# Patient Record
Sex: Female | Born: 1990 | Race: Black or African American | Hispanic: No | Marital: Single | Smoking: Never smoker
Health system: Southern US, Community
[De-identification: ages and names within clinical notes are randomized; demographics above are authoritative.]

## PROBLEM LIST (undated history)

## (undated) DIAGNOSIS — K219 Gastro-esophageal reflux disease without esophagitis: Secondary | ICD-10-CM

## (undated) DIAGNOSIS — E0590 Thyrotoxicosis, unspecified without thyrotoxic crisis or storm: Secondary | ICD-10-CM

## (undated) HISTORY — PX: THYROIDECTOMY: SHX17

## (undated) HISTORY — PX: UPPER GI ENDOSCOPY: SHX6162

## (undated) HISTORY — PX: TONSILLECTOMY: SUR1361

---

## 2015-08-01 ENCOUNTER — Encounter (HOSPITAL_COMMUNITY): Payer: Self-pay | Admitting: *Deleted

## 2015-08-01 ENCOUNTER — Inpatient Hospital Stay (HOSPITAL_COMMUNITY)
Admission: AD | Admit: 2015-08-01 | Discharge: 2015-08-01 | Disposition: A | Discharge status: Home / Self Care | Payer: Self-pay | Source: Ambulatory Visit | Attending: Family Medicine | Admitting: Family Medicine

## 2015-08-01 DIAGNOSIS — J449 Chronic obstructive pulmonary disease, unspecified: Secondary | ICD-10-CM | POA: Insufficient documentation

## 2015-08-01 DIAGNOSIS — K529 Noninfective gastroenteritis and colitis, unspecified: Secondary | ICD-10-CM | POA: Insufficient documentation

## 2015-08-01 DIAGNOSIS — E0590 Thyrotoxicosis, unspecified without thyrotoxic crisis or storm: Secondary | ICD-10-CM | POA: Insufficient documentation

## 2015-08-01 HISTORY — DX: Thyrotoxicosis, unspecified without thyrotoxic crisis or storm: E05.90

## 2015-08-01 HISTORY — DX: Gastro-esophageal reflux disease without esophagitis: K21.9

## 2015-08-01 LAB — URINALYSIS, ROUTINE W REFLEX MICROSCOPIC
Bilirubin Urine: NEGATIVE
Glucose, UA: NEGATIVE mg/dL
Ketones, ur: 40 mg/dL — AB
LEUKOCYTES UA: NEGATIVE
NITRITE: NEGATIVE
PROTEIN: NEGATIVE mg/dL
UROBILINOGEN UA: 0.2 mg/dL (ref 0.0–1.0)
pH: 6 (ref 5.0–8.0)

## 2015-08-01 LAB — CBC WITH DIFFERENTIAL/PLATELET
BASOS ABS: 0 10*3/uL (ref 0.0–0.1)
BASOS PCT: 0 % (ref 0–1)
EOS PCT: 1 % (ref 0–5)
Eosinophils Absolute: 0.1 10*3/uL (ref 0.0–0.7)
HEMATOCRIT: 40.5 % (ref 36.0–46.0)
Hemoglobin: 14.3 g/dL (ref 12.0–15.0)
Lymphocytes Relative: 31 % (ref 12–46)
Lymphs Abs: 2.5 10*3/uL (ref 0.7–4.0)
MCH: 28.7 pg (ref 26.0–34.0)
MCHC: 35.3 g/dL (ref 30.0–36.0)
MCV: 81.3 fL (ref 78.0–100.0)
MONO ABS: 0.8 10*3/uL (ref 0.1–1.0)
MONOS PCT: 9 % (ref 3–12)
Neutro Abs: 4.9 10*3/uL (ref 1.7–7.7)
Neutrophils Relative %: 59 % (ref 43–77)
PLATELETS: 149 10*3/uL — AB (ref 150–400)
RBC: 4.98 MIL/uL (ref 3.87–5.11)
RDW: 12.3 % (ref 11.5–15.5)
WBC: 8.3 10*3/uL (ref 4.0–10.5)

## 2015-08-01 LAB — URINE MICROSCOPIC-ADD ON

## 2015-08-01 LAB — POCT PREGNANCY, URINE: PREG TEST UR: NEGATIVE

## 2015-08-01 MED ORDER — PROMETHAZINE HCL 25 MG PO TABS: 25.0000 mg | ORAL_TABLET | Freq: Four times a day (QID) | ORAL | Status: DC | PRN

## 2015-08-01 MED ORDER — ONDANSETRON 8 MG PO TBDP
8.0000 mg | ORAL_TABLET | Freq: Once | ORAL | Status: AC
  Administered 2015-08-01: 8 mg via ORAL
  Filled 2015-08-01: qty 1

## 2015-08-01 NOTE — MAU Note (Signed)
Pt states here for n/v x3 days. Able to keep mashed potatoes down. No vomiting today, just spitting. Bleeding at present. No abnormal vag d/c. Stomach feels weak.

## 2015-08-01 NOTE — MAU Provider Note (Signed)
History     CSN: [id number]  Arrival date and time: 08/01/15 1537   First Provider Initiated Contact with Patient 08/01/15 1756      Chief Complaint  Patient presents with  . Emesis   HPI  Evelyn [name] is a 24 y.o. non pregnant female patient who presents for vomiting.  Pt reports n/v since Friday. Diarrhea over the weekend. Denies vomiting or diarrhea today. States abdomen feels sore and weak but denies pain.  Denies fever.  Able to keep fluids down today but has not tried to eat solids.  Does not have PCP; goes to [facility] for gyn.      Past Medical History  Diagnosis Date  . Hyperthyroidism   . GERD (gastroesophageal reflux disease)     Past Surgical History  Procedure Laterality Date  . Tonsillectomy    . Upper gi endoscopy      History reviewed. No pertinent family history.  Social History  Substance Use Topics  . Smoking status: Never Smoker   . Smokeless tobacco: None  . Alcohol Use: Yes    Allergies:  Allergies  Allergen Reactions  . Penicillins Other (See Comments)    Pt reports "tongue swelling."    Prescriptions prior to admission  Medication Sig Dispense Refill Last Dose  . Cholecalciferol (VITAMIN D-3 PO) Take 1 capsule by mouth daily.   Past Week at Unknown time  . methimazole (TAPAZOLE) 5 MG tablet Take 5 mg by mouth 3 (three) times daily.   07/31/2015 at Unknown time  . Multiple Vitamins-Minerals (MULTIVITAMIN PO) Take 1 tablet by mouth daily.   Past Week at Unknown time  . Probiotic Product (PROBIOTIC PO) Take 1 capsule by mouth once.   08/01/2015 at Unknown time    Review of Systems  Constitutional: Negative.  Negative for fever.  Respiratory: Negative.   Cardiovascular: Negative.   Gastrointestinal: Positive for heartburn and nausea. Negative for vomiting and abdominal pain.  Genitourinary: Negative.   Neurological: Negative for headaches.   Physical Exam   Blood pressure 145/87, pulse 55, temperature 98 F (36.7 C),  temperature source Oral, resp. rate 18, height 5' (1.524 m), weight 107 lb 4 oz (48.648 kg), last menstrual period 07/11/2015.  Physical Exam  Constitutional: She is oriented to person, place, and time. She appears well-developed and well-nourished. No distress.  Cardiovascular: Normal rate, regular rhythm and normal heart sounds.   Respiratory: Effort normal and breath sounds normal. No respiratory distress. She has no wheezes.  GI: Soft. Bowel sounds are normal. She exhibits no distension. There is no tenderness.  Neurological: She is alert and oriented to person, place, and time.  Skin: Skin is warm and dry. She is not diaphoretic. No erythema.  Psychiatric: She has a normal mood and affect. Her behavior is normal. Judgment and thought content normal.    MAU Course  Procedures Results for orders placed or performed during the hospital encounter of 08/01/15 (from the past 24 hour(s))  Urinalysis, Routine w reflex microscopic (not at [facility])     Status: Abnormal   Collection Time: 08/01/15  5:20 PM  Result Value Ref Range   Color, Urine YELLOW YELLOW   APPearance CLEAR CLEAR   Specific Gravity, Urine <1.005 (L) 1.005 - 1.030   pH 6.0 5.0 - 8.0   Glucose, UA NEGATIVE NEGATIVE mg/dL   Hgb urine dipstick LARGE (A) NEGATIVE   Bilirubin Urine NEGATIVE NEGATIVE   Ketones, ur 40 (A) NEGATIVE mg/dL   Protein, ur NEGATIVE NEGATIVE mg/dL  Urobilinogen, UA 0.2 0.0 - 1.0 mg/dL   Nitrite NEGATIVE NEGATIVE   Leukocytes, UA NEGATIVE NEGATIVE  Urine microscopic-add on     Status: Abnormal   Collection Time: 08/01/15  5:20 PM  Result Value Ref Range   Squamous Epithelial / LPF FEW (A) RARE   WBC, UA 0-2 <3 WBC/hpf   RBC / HPF 0-2 <3 RBC/hpf   Bacteria, UA FEW (A) RARE  Pregnancy, urine POC     Status: None   Collection Time: 08/01/15  5:26 PM  Result Value Ref Range   Preg Test, Ur NEGATIVE NEGATIVE    MDM UPT negative CBC with diff Zofran ODT given Nausea improved with zofran; pt did  not vomit while in MAU  Assessment and Plan  A: 1. Gastroenteritis    P: Discharge home in stable condition Rx for phenergan Discussed BRAT diet If symptoms worsen or continue, go to PCP or local ED  Judeth Horn, NP  08/01/2015, 5:57 PM

## 2015-08-01 NOTE — Discharge Instructions (Signed)
Viral Gastroenteritis Viral gastroenteritis is also called stomach flu. This illness is caused by a certain type of germ (virus). It can cause sudden watery poop (diarrhea) and throwing up (vomiting). This can cause you to lose body fluids (dehydration). This illness usually lasts for 3 to 8 days. It usually goes away on its own. HOME CARE   Drink enough fluids to keep your pee (urine) clear or pale yellow. Drink small amounts of fluids often.  Ask your doctor how to replace body fluid losses (rehydration).  Avoid:  Foods high in sugar.  Alcohol.  Bubbly (carbonated) drinks.  Tobacco.  Juice.  Caffeine drinks.  Very hot or cold fluids.  Fatty, greasy foods.  Eating too much at one time.  Dairy products until 24 to 48 hours after your watery poop stops.  You may eat foods with active cultures (probiotics). They can be found in some yogurts and supplements.  Wash your hands well to avoid spreading the illness.  Only take medicines as told by your doctor. Do not give aspirin to children. Do not take medicines for watery poop (antidiarrheals).  Ask your doctor if you should keep taking your regular medicines.  Keep all doctor visits as told. GET HELP RIGHT AWAY IF:   You cannot keep fluids down.  You do not pee at least once every 6 to 8 hours.  You are short of breath.  You see blood in your poop or throw up. This may look like coffee grounds.  You have belly (abdominal) pain that gets worse or is just in one small spot (localized).  You keep throwing up or having watery poop.  You have a fever.  The patient is a child younger than 3 months, and he or she has a fever.  The patient is a child older than 3 months, and he or she has a fever and problems that do not go away.  The patient is a child older than 3 months, and he or she has a fever and problems that suddenly get worse.  The patient is a baby, and he or she has no tears when crying. MAKE SURE YOU:     Understand these instructions.  Will watch your condition.  Will get help right away if you are not doing well or get worse. Document Released: 05/14/2008 Document Revised: 02/18/2012 Document Reviewed: 09/12/2011 ExitCare Patient Information 2015 [employer]. This information is not intended to replace advice given to you by your health care provider. Make sure you discuss any questions you have with your health care provider.  

## 2019-09-02 ENCOUNTER — Encounter (HOSPITAL_COMMUNITY): Payer: Self-pay | Admitting: *Deleted

## 2019-09-02 ENCOUNTER — Inpatient Hospital Stay (HOSPITAL_COMMUNITY): Payer: Medicaid Other

## 2019-09-02 ENCOUNTER — Inpatient Hospital Stay (HOSPITAL_COMMUNITY)
Admission: AD | Admit: 2019-09-02 | Discharge: 2019-09-02 | Disposition: A | Discharge status: Home / Self Care | Payer: Medicaid Other | Attending: Obstetrics and Gynecology | Admitting: Obstetrics and Gynecology

## 2019-09-02 ENCOUNTER — Other Ambulatory Visit: Payer: Self-pay

## 2019-09-02 DIAGNOSIS — O4691 Antepartum hemorrhage, unspecified, first trimester: Secondary | ICD-10-CM

## 2019-09-02 DIAGNOSIS — E876 Hypokalemia: Secondary | ICD-10-CM

## 2019-09-02 DIAGNOSIS — O219 Vomiting of pregnancy, unspecified: Secondary | ICD-10-CM

## 2019-09-02 DIAGNOSIS — Z880 Allergy status to penicillin: Secondary | ICD-10-CM | POA: Insufficient documentation

## 2019-09-02 DIAGNOSIS — Z3A08 8 weeks gestation of pregnancy: Secondary | ICD-10-CM | POA: Insufficient documentation

## 2019-09-02 DIAGNOSIS — E0590 Thyrotoxicosis, unspecified without thyrotoxic crisis or storm: Secondary | ICD-10-CM | POA: Insufficient documentation

## 2019-09-02 DIAGNOSIS — Z79899 Other long term (current) drug therapy: Secondary | ICD-10-CM | POA: Diagnosis not present

## 2019-09-02 DIAGNOSIS — O4690 Antepartum hemorrhage, unspecified, unspecified trimester: Secondary | ICD-10-CM

## 2019-09-02 DIAGNOSIS — O99281 Endocrine, nutritional and metabolic diseases complicating pregnancy, first trimester: Secondary | ICD-10-CM | POA: Diagnosis not present

## 2019-09-02 DIAGNOSIS — Z3A01 Less than 8 weeks gestation of pregnancy: Secondary | ICD-10-CM | POA: Diagnosis not present

## 2019-09-02 DIAGNOSIS — O209 Hemorrhage in early pregnancy, unspecified: Secondary | ICD-10-CM | POA: Insufficient documentation

## 2019-09-02 LAB — COMPREHENSIVE METABOLIC PANEL
ALT: 18 U/L (ref 0–44)
AST: 22 U/L (ref 15–41)
Albumin: 4.3 g/dL (ref 3.5–5.0)
Alkaline Phosphatase: 32 U/L — ABNORMAL LOW (ref 38–126)
Anion gap: 16 — ABNORMAL HIGH (ref 5–15)
BUN: 11 mg/dL (ref 6–20)
CO2: 21 mmol/L — ABNORMAL LOW (ref 22–32)
Calcium: 9.4 mg/dL (ref 8.9–10.3)
Chloride: 92 mmol/L — ABNORMAL LOW (ref 98–111)
Creatinine, Ser: 0.73 mg/dL (ref 0.44–1.00)
GFR calc Af Amer: >60 mL/min (ref >60)
GFR calc non Af Amer: >60 mL/min (ref >60)
Glucose, Bld: 78 mg/dL (ref 70–99)
Potassium: 3 mmol/L — ABNORMAL LOW (ref 3.5–5.1)
Sodium: 129 mmol/L — ABNORMAL LOW (ref 135–145)
Total Bilirubin: 2.9 mg/dL — ABNORMAL HIGH (ref 0.3–1.2)
Total Protein: 7.5 g/dL (ref 6.5–8.1)

## 2019-09-02 LAB — URINALYSIS, ROUTINE W REFLEX MICROSCOPIC
Bilirubin Urine: NEGATIVE
Glucose, UA: NEGATIVE mg/dL
Ketones, ur: 80 mg/dL — AB
Leukocytes,Ua: NEGATIVE
Nitrite: NEGATIVE
Protein, ur: 100 mg/dL — AB
Specific Gravity, Urine: 1.028 (ref 1.005–1.030)
pH: 6 (ref 5.0–8.0)

## 2019-09-02 LAB — CBC
HCT: 39.1 % (ref 36.0–46.0)
Hemoglobin: 14.6 g/dL (ref 12.0–15.0)
MCH: 30.2 pg (ref 26.0–34.0)
MCHC: 37.3 g/dL — ABNORMAL HIGH (ref 30.0–36.0)
MCV: 80.8 fL (ref 80.0–100.0)
Platelets: 173 10*3/uL (ref 150–400)
RBC: 4.84 MIL/uL (ref 3.87–5.11)
RDW: 11.4 % — ABNORMAL LOW (ref 11.5–15.5)
WBC: 10 10*3/uL (ref 4.0–10.5)
nRBC: 0 % (ref 0.0–0.2)

## 2019-09-02 LAB — ABO/RH: ABO/RH(D): O POS

## 2019-09-02 LAB — POCT PREGNANCY, URINE: Preg Test, Ur: POSITIVE — AB

## 2019-09-02 LAB — HCG, QUANTITATIVE, PREGNANCY: hCG, Beta Chain, Quant, S: 167059 m[IU]/mL — ABNORMAL HIGH (ref <5)

## 2019-09-02 MED ORDER — FAMOTIDINE IN NACL 20-0.9 MG/50ML-% IV SOLN
20.0000 mg | Freq: Once | INTRAVENOUS | Status: AC
  Administered 2019-09-02: 12:00:00 20 mg via INTRAVENOUS
  Filled 2019-09-02: qty 50

## 2019-09-02 MED ORDER — PROCHLORPERAZINE EDISYLATE 10 MG/2ML IJ SOLN
10.0000 mg | Freq: Once | INTRAMUSCULAR | Status: AC
  Administered 2019-09-02: 10 mg via INTRAVENOUS
  Filled 2019-09-02: qty 2

## 2019-09-02 MED ORDER — POTASSIUM CHLORIDE ER 10 MEQ PO TBCR: 10.0000 meq | EXTENDED_RELEASE_TABLET | Freq: Every day | ORAL | 0 refills | Status: DC

## 2019-09-02 MED ORDER — M.V.I. ADULT IV INJ
Freq: Once | INTRAVENOUS | Status: AC
  Administered 2019-09-02: 14:00:00 via INTRAVENOUS
  Filled 2019-09-02: qty 10

## 2019-09-02 MED ORDER — LACTATED RINGERS IV BOLUS
1000.0000 mL | Freq: Once | INTRAVENOUS | Status: AC
  Administered 2019-09-02: 1000 mL via INTRAVENOUS

## 2019-09-02 MED ORDER — PROCHLORPERAZINE MALEATE 10 MG PO TABS: 10.0000 mg | ORAL_TABLET | Freq: Two times a day (BID) | ORAL | 0 refills | Status: DC | PRN

## 2019-09-02 NOTE — MAU Note (Signed)
Been sick, very very sick, can't keep anything down, no food or fluids.  preg  Was seen up at [facility] on the 17th, was given Phenergan- not helping. Saw some old blood this morning when woke up, this was the first episode of bleeding. Stomach is sore.

## 2019-09-02 NOTE — Progress Notes (Signed)
States last took Phenergan over 24hrs ago.

## 2019-09-02 NOTE — Discharge Instructions (Signed)
Morning Sickness  Morning sickness is when you feel sick to your stomach (nauseous) during pregnancy. You may feel sick to your stomach and throw up (vomit). You may feel sick in the morning, but you can feel this way at any time of day. Some women feel very sick to their stomach and cannot stop throwing up (hyperemesis gravidarum). Follow these instructions at home: Medicines  Take over-the-counter and prescription medicines only as told by your doctor. Do not take any medicines until you talk with your doctor about them first.  Taking multivitamins before getting pregnant can stop or lessen the harshness of morning sickness. Eating and drinking  Eat dry toast or crackers before getting out of bed.  Eat 5 or 6 small meals a day.  Eat dry and bland foods like rice and baked potatoes.  Do not eat greasy, fatty, or spicy foods.  Have someone cook for you if the smell of food causes you to feel sick or throw up.  If you feel sick to your stomach after taking prenatal vitamins, take them at night or with a snack.  Eat protein when you need a snack. Nuts, yogurt, and cheese are good choices.  Drink fluids throughout the day.  Try ginger ale made with real ginger, ginger tea made from fresh grated ginger, or ginger candies. General instructions  Do not use any products that have nicotine or tobacco in them, such as cigarettes and e-cigarettes. If you need help quitting, ask your doctor.  Use an air purifier to keep the air in your house free of smells.  Get lots of fresh air.  Try to avoid smells that make you feel sick.  Try: ? Wearing a bracelet that is used for seasickness (acupressure wristband). ? Going to a doctor who puts thin needles into certain body points (acupuncture) to improve how you feel. Contact a doctor if:  You need medicine to feel better.  You feel dizzy or light-headed.  You are losing weight. Get help right away if:  You feel very sick to your  stomach and cannot stop throwing up.  You pass out (faint).  You have very bad pain in your belly. Summary  Morning sickness is when you feel sick to your stomach (nauseous) during pregnancy.  You may feel sick in the morning, but you can feel this way at any time of day.  Making some changes to what you eat may help your symptoms go away. This information is not intended to replace advice given to you by your health care provider. Make sure you discuss any questions you have with your health care provider. Document Released: 01/03/2005 Document Revised: 11/08/2017 Document Reviewed: 12/27/2016 Elsevier Patient Education  2020 [employer].   Hypokalemia Hypokalemia means that the amount of potassium in the blood is lower than normal. Potassium is a chemical (electrolyte) that helps regulate the amount of fluid in the body. It also stimulates muscle tightening (contraction) and helps nerves work properly. Normally, most of the body's potassium is inside cells, and only a very small amount is in the blood. Because the amount in the blood is so small, minor changes to potassium levels in the blood can be life-threatening. What are the causes? This condition may be caused by:  Antibiotic medicine.  Diarrhea or vomiting. Taking too much of a medicine that helps you have a bowel movement (laxative) can cause diarrhea and lead to hypokalemia.  Chronic kidney disease (CKD).  Medicines that help the body get rid  of excess fluid (diuretics).  Eating disorders, such as bulimia.  Low magnesium levels in the body.  Sweating a lot. What are the signs or symptoms? Symptoms of this condition include:  Weakness.  Constipation.  Fatigue.  Muscle cramps.  Mental confusion.  Skipped heartbeats or irregular heartbeat (palpitations).  Tingling or numbness. How is this diagnosed? This condition is diagnosed with a blood test. How is this treated? This condition may be treated  by:  Taking potassium supplements by mouth.  Adjusting the medicines that you take.  Eating more foods that contain a lot of potassium. If your potassium level is very low, you may need to get potassium through an IV and be monitored in the hospital. Follow these instructions at home:   Take over-the-counter and prescription medicines only as told by your health care provider. This includes vitamins and supplements.  Eat a healthy diet. A healthy diet includes fresh fruits and vegetables, whole grains, healthy fats, and lean proteins.  If instructed, eat more foods that contain a lot of potassium. This includes: ? Nuts, such as peanuts and pistachios. ? Seeds, such as sunflower seeds and pumpkin seeds. ? Peas, lentils, and lima beans. ? Whole grain and bran cereals and breads. ? Fresh fruits and vegetables, such as apricots, avocado, bananas, cantaloupe, kiwi, oranges, tomatoes, asparagus, and potatoes. ? Orange juice. ? Tomato juice. ? Red meats. ? Yogurt.  Keep all follow-up visits as told by your health care provider. This is important. Contact a health care provider if you:  Have weakness that gets worse.  Feel your heart pounding or racing.  Vomit.  Have diarrhea.  Have diabetes (diabetes mellitus) and you have trouble keeping your blood sugar (glucose) in your target range. Get help right away if you:  Have chest pain.  Have shortness of breath.  Have vomiting or diarrhea that lasts for more than 2 days.  Faint. Summary  Hypokalemia means that the amount of potassium in the blood is lower than normal.  This condition is diagnosed with a blood test.  Hypokalemia may be treated by taking potassium supplements, adjusting the medicines that you take, or eating more foods that are high in potassium.  If your potassium level is very low, you may need to get potassium through an IV and be monitored in the hospital. This information is not intended to replace  advice given to you by your health care provider. Make sure you discuss any questions you have with your health care provider. Document Released: 11/26/2005 Document Revised: 07/09/2018 Document Reviewed: 07/09/2018 Elsevier Patient Education  2020 [employer].

## 2019-09-02 NOTE — MAU Provider Note (Signed)
History     CSN: [id number]  Arrival date and time: 09/02/19 1032   First Provider Initiated Contact with Patient 09/02/19 1147     Chief Complaint  Patient presents with  . Emesis   Evelyn [name] is a 28 y.o. G1P0 at [redacted]w[redacted]d who presents to MAU with nausea and vomiting. She reports this has been occurring for the past 2 weeks. Was seen at [facility] on 9/17- was given phenergan. She reports getting one bag of fluid and was sent home with Rx. She reports being unable to keep any down- fluids or food. Reports vomiting around 20 times per day. She reports weighing 106lbs prior to getting pregnant, today she weighs 99.6lbs. She denies lower abdominal cramping reports upper abdominal pain- epigastric, "stomach ache", rates pain 9/10. Reports seeing old red blood in urine when she wiped this morning, denies passing clots or having to wear pad or panty liner. Denies vaginal discharge or urinary symptoms. She reports headache which is associated with dehydration. Rates HA 8/10.   OB History    Gravida  1   Para  0   Term  0   Preterm  0   AB  0   Living  0     SAB  0   TAB  0   Ectopic  0   Multiple  0   Live Births              Past Medical History:  Diagnosis Date  . GERD (gastroesophageal reflux disease)   . Hyperthyroidism     Past Surgical History:  Procedure Laterality Date  . THYROIDECTOMY    . TONSILLECTOMY    . UPPER GI ENDOSCOPY      No family history on file.  Social History   Tobacco Use  . Smoking status: Never Smoker  . Smokeless tobacco: Never Used  Substance Use Topics  . Alcohol use: Not Currently  . Drug use: Yes    Types: Marijuana    Comment: quit when found out pregnant    Allergies:  Allergies  Allergen Reactions  . Penicillins Other (See Comments)    Pt reports "tongue swelling."    Medications Prior to Admission  Medication Sig Dispense Refill Last Dose  . Cholecalciferol (VITAMIN D-3 PO) Take 1 capsule by mouth  daily.     . methimazole (TAPAZOLE) 5 MG tablet Take 5 mg by mouth 3 (three) times daily.     . Multiple Vitamins-Minerals (MULTIVITAMIN PO) Take 1 tablet by mouth daily.     . Probiotic Product (PROBIOTIC PO) Take 1 capsule by mouth once.     . promethazine (PHENERGAN) 25 MG tablet Take 1 tablet (25 mg total) by mouth every 6 (six) hours as needed for nausea or vomiting. 30 tablet 0     Review of Systems  Constitutional: Negative.   Respiratory: Negative.   Cardiovascular: Negative.   Gastrointestinal: Positive for abdominal pain, nausea and vomiting. Negative for constipation and diarrhea.       Epigastric pain  Genitourinary: Positive for vaginal bleeding. Negative for difficulty urinating, dysuria, frequency, hematuria, urgency and vaginal discharge.  Musculoskeletal: Negative.   Neurological: Positive for weakness and headaches. Negative for dizziness, syncope and light-headedness.  Psychiatric/Behavioral: Negative.    Physical Exam   Blood pressure 135/80, pulse 73, temperature 98.3 F (36.8 C), temperature source Oral, resp. rate 14, height 5\' 1"  (1.549 m), weight 45.3 kg, last menstrual period 07/05/2019, SpO2 100 %.  Physical Exam  Nursing note  and vitals reviewed. Constitutional: She is oriented to person, place, and time. She appears well-developed and well-nourished. No distress.  HENT:  Head: Normocephalic.  Cardiovascular: Normal rate and regular rhythm.  Respiratory: Effort normal and breath sounds normal. No respiratory distress. She has no wheezes.  GI: Soft. Bowel sounds are normal. She exhibits no distension. There is abdominal tenderness. There is no rebound.  Tenderness in the epigastric region  Genitourinary:    Genitourinary Comments: No vaginal bleeding currently    Musculoskeletal: Normal range of motion.        General: No edema.  Neurological: She is alert and oriented to person, place, and time.  Skin:  Mucous membranes dry   Psychiatric: She has a  normal mood and affect. Her behavior is normal. Thought content normal.    MAU Course  Procedures  MDM Orders Placed This Encounter  Procedures  . US OB LESS THAN 14 WEEKS WITH OB TRANSVAGINAL  . US OB Transvaginal  . Urinalysis, Routine w reflex microscopic  . CBC  . hCG, quantitative, pregnancy  . Comprehensive metabolic panel  . Pregnancy, urine POC  . ABO/Rh  . Insert peripheral IV   Meds ordered this encounter  Medications  . lactated ringers bolus 1,000 mL  . prochlorperazine (COMPAZINE) injection 10 mg  . multivitamins adult (INFUVITE ADULT) 10 mL in lactated ringers 1,000 mL infusion  . famotidine (PEPCID) IVPB 20 mg premix  . prochlorperazine (COMPAZINE) 10 MG tablet    Sig: Take 1 tablet (10 mg total) by mouth 2 (two) times daily as needed for nausea or vomiting.    Dispense:  20 tablet    Refill:  0    Order Specific Question:   Supervising Provider    Answer:   ERVIN, MICHAEL L [[id number]]  . potassium chloride (K-DUR) 10 MEQ tablet    Sig: Take 1 tablet (10 mEq total) by mouth daily.    Dispense:  4 tablet    Refill:  0    Order Specific Question:   Supervising Provider    Answer:   Rip Harbour, MICHAEL L [[id number]]   Treatments in MAU included IV LR bolus, compazine IV, banana bag and pepcid IV. PO challenge after treatment. Patient able to keep down crackers and sprite.   Discussed [place] results with patient. Educated on low FHR with [place] and dating changed to [redacted]w[redacted]d- EDD 04/27/2019. Discussed need for repeat US in 1-2 weeks for viability- threatened miscarriage precautions reviewed. Pt stable at time of discharge.   Rx for compazine and KDur sent to pharmacy of choice  Assessment and Plan   1. Nausea and vomiting during pregnancy   2. Vaginal bleeding during pregnancy   3. Hypokalemia    Discharge home Instructed to call office and have NOB appointment moved back based on new EDD Take medication as prescribed  Return to MAU as needed for emergencies   Follow-up  Information    [facility] Follow up.   Specialty: Obstetrics and Gynecology Why: Return to MAU as needed for emergencies  Contact information: [address] [id number] [place] [phone]         Allergies as of 09/02/2019      Reactions   Penicillins Other (See Comments)   Pt reports "tongue swelling."      Medication List    STOP taking these medications   methimazole 5 MG tablet Commonly known as: TAPAZOLE     TAKE these medications   MULTIVITAMIN  PO Take 1 tablet by mouth daily.   potassium chloride 10 MEQ tablet Commonly known as: K-DUR Take 1 tablet (10 mEq total) by mouth daily.   PROBIOTIC PO Take 1 capsule by mouth once.   prochlorperazine 10 MG tablet Commonly known as: COMPAZINE Take 1 tablet (10 mg total) by mouth 2 (two) times daily as needed for nausea or vomiting.   promethazine 25 MG tablet Commonly known as: PHENERGAN Take 1 tablet (25 mg total) by mouth every 6 (six) hours as needed for nausea or vomiting.   VITAMIN D-3 PO Take 1 capsule by mouth daily.       Sharyon Cable CNM 09/02/2019, 5:01 PM

## 2019-09-13 ENCOUNTER — Inpatient Hospital Stay (HOSPITAL_COMMUNITY)
Admission: AD | Admit: 2019-09-13 | Discharge: 2019-09-14 | Disposition: A | Discharge status: Home / Self Care | Payer: Medicaid Other | Attending: Obstetrics and Gynecology | Admitting: Obstetrics and Gynecology

## 2019-09-13 ENCOUNTER — Other Ambulatory Visit: Payer: Self-pay

## 2019-09-13 ENCOUNTER — Encounter (HOSPITAL_COMMUNITY): Payer: Self-pay

## 2019-09-13 DIAGNOSIS — M545 Low back pain: Secondary | ICD-10-CM | POA: Insufficient documentation

## 2019-09-13 DIAGNOSIS — O99322 Drug use complicating pregnancy, second trimester: Secondary | ICD-10-CM | POA: Insufficient documentation

## 2019-09-13 DIAGNOSIS — O210 Mild hyperemesis gravidarum: Secondary | ICD-10-CM | POA: Diagnosis present

## 2019-09-13 DIAGNOSIS — O219 Vomiting of pregnancy, unspecified: Secondary | ICD-10-CM | POA: Diagnosis not present

## 2019-09-13 DIAGNOSIS — Z3A01 Less than 8 weeks gestation of pregnancy: Secondary | ICD-10-CM | POA: Insufficient documentation

## 2019-09-13 DIAGNOSIS — F1990 Other psychoactive substance use, unspecified, uncomplicated: Secondary | ICD-10-CM

## 2019-09-13 DIAGNOSIS — F12188 Cannabis abuse with other cannabis-induced disorder: Secondary | ICD-10-CM | POA: Insufficient documentation

## 2019-09-13 DIAGNOSIS — Z3490 Encounter for supervision of normal pregnancy, unspecified, unspecified trimester: Secondary | ICD-10-CM

## 2019-09-13 DIAGNOSIS — O99321 Drug use complicating pregnancy, first trimester: Secondary | ICD-10-CM | POA: Diagnosis not present

## 2019-09-13 DIAGNOSIS — F1290 Cannabis use, unspecified, uncomplicated: Secondary | ICD-10-CM

## 2019-09-13 DIAGNOSIS — R109 Unspecified abdominal pain: Secondary | ICD-10-CM | POA: Diagnosis not present

## 2019-09-13 DIAGNOSIS — R1116 Cannabis hyperemesis syndrome: Secondary | ICD-10-CM

## 2019-09-13 LAB — URINALYSIS, ROUTINE W REFLEX MICROSCOPIC
Bilirubin Urine: NEGATIVE
Glucose, UA: NEGATIVE mg/dL
Hgb urine dipstick: NEGATIVE
Ketones, ur: 80 mg/dL — AB
Nitrite: NEGATIVE
Protein, ur: 100 mg/dL — AB
Specific Gravity, Urine: 1.029 (ref 1.005–1.030)
pH: 6 (ref 5.0–8.0)

## 2019-09-13 LAB — RAPID URINE DRUG SCREEN, HOSP PERFORMED
Amphetamines: NOT DETECTED
Barbiturates: NOT DETECTED
Benzodiazepines: NOT DETECTED
Cocaine: NOT DETECTED
Opiates: NOT DETECTED
Tetrahydrocannabinol: POSITIVE — AB

## 2019-09-13 MED ORDER — LACTATED RINGERS IV BOLUS
1000.0000 mL | Freq: Once | INTRAVENOUS | Status: AC
  Administered 2019-09-13: 1000 mL via INTRAVENOUS

## 2019-09-13 MED ORDER — DIPHENHYDRAMINE HCL 50 MG/ML IJ SOLN
25.0000 mg | Freq: Once | INTRAMUSCULAR | Status: AC
  Administered 2019-09-13: 25 mg via INTRAVENOUS
  Filled 2019-09-13: qty 1

## 2019-09-13 MED ORDER — ONDANSETRON 4 MG PO TBDP
4.0000 mg | ORAL_TABLET | Freq: Once | ORAL | Status: AC
  Administered 2019-09-13: 4 mg via ORAL
  Filled 2019-09-13: qty 1

## 2019-09-13 MED ORDER — PROMETHAZINE HCL 25 MG PO TABS: 25.0000 mg | ORAL_TABLET | Freq: Four times a day (QID) | ORAL | 0 refills | Status: DC | PRN

## 2019-09-13 MED ORDER — M.V.I. ADULT IV INJ
Freq: Once | INTRAVENOUS | Status: AC
  Administered 2019-09-13: 22:00:00 via INTRAVENOUS
  Filled 2019-09-13: qty 10

## 2019-09-13 MED ORDER — HALOPERIDOL LACTATE 5 MG/ML IJ SOLN
5.0000 mg | Freq: Once | INTRAMUSCULAR | Status: AC
  Administered 2019-09-13: 22:00:00 5 mg via INTRAVENOUS
  Filled 2019-09-13: qty 1

## 2019-09-13 MED ORDER — PYRIDOXINE HCL 25 MG PO TABS: 25.0000 mg | ORAL_TABLET | Freq: Three times a day (TID) | ORAL | 0 refills | Status: DC

## 2019-09-13 MED ORDER — DOXYLAMINE SUCCINATE (SLEEP) 25 MG PO TABS: 25.0000 mg | ORAL_TABLET | Freq: Three times a day (TID) | ORAL | 0 refills | Status: DC | PRN

## 2019-09-13 NOTE — MAU Provider Note (Signed)
History     CSN: [id number]  Arrival date and time: 09/13/19 5537   First Provider Initiated Contact with Patient 09/13/19 2027     Chief Complaint  Patient presents with  . Nausea  . Emesis   HPI  Evelyn [name] is a 28 y.o. G1P0000 at [redacted]w[redacted]d who presents to MAU with chief complaint of severe, recurrent nausea and vomiting. Patient estimates she vomits more than 10 times each day. She was evaluated in MAU for this complaint on 09/02/2019 and prescribed Phenergan and Compazine. She most recently took those medications this morning but did not experience relief.  She cannot remember when she last tolerated solid food but endorses attempting applesauce and broth earlier today without success. She reports abdominal cramping and low back pain during vomiting episodes. She denies vaginal bleeding, abnormal vaginal discharge, abdominal tenderness, weakness or syncope.  OB History    Gravida  1   Para  0   Term  0   Preterm  0   AB  0   Living  0     SAB  0   TAB  0   Ectopic  0   Multiple  0   Live Births              Past Medical History:  Diagnosis Date  . GERD (gastroesophageal reflux disease)   . Hyperthyroidism     Past Surgical History:  Procedure Laterality Date  . THYROIDECTOMY    . TONSILLECTOMY    . UPPER GI ENDOSCOPY      No family history on file.  Social History   Tobacco Use  . Smoking status: Never Smoker  . Smokeless tobacco: Never Used  Substance Use Topics  . Alcohol use: Not Currently  . Drug use: Yes    Types: Marijuana    Comment: quit when found out pregnant    Allergies:  Allergies  Allergen Reactions  . Penicillins Other (See Comments)    Pt reports "tongue swelling."    Medications Prior to Admission  Medication Sig Dispense Refill Last Dose  . Multiple Vitamins-Minerals (MULTIVITAMIN PO) Take 1 tablet by mouth daily.   09/13/2019 at Unknown time  . prochlorperazine (COMPAZINE) 10 MG tablet Take 1 tablet (10 mg total)  by mouth 2 (two) times daily as needed for nausea or vomiting. 20 tablet 0 09/13/2019 at Unknown time  . Cholecalciferol (VITAMIN D-3 PO) Take 1 capsule by mouth daily.     . potassium chloride (K-DUR) 10 MEQ tablet Take 1 tablet (10 mEq total) by mouth daily. 4 tablet 0   . Probiotic Product (PROBIOTIC PO) Take 1 capsule by mouth once.     . promethazine (PHENERGAN) 25 MG tablet Take 1 tablet (25 mg total) by mouth every 6 (six) hours as needed for nausea or vomiting. 30 tablet 0     Review of Systems  Constitutional: Positive for fatigue. Negative for chills and fever.  Gastrointestinal: Positive for abdominal pain, nausea and vomiting.  Genitourinary: Negative for difficulty urinating, dysuria, flank pain, vaginal bleeding, vaginal discharge and vaginal pain.  Musculoskeletal: Positive for back pain.  Neurological: Negative for dizziness, syncope, weakness and headaches.  All other systems reviewed and are negative.  Physical Exam   Pulse (!) 114, temperature 98.4 F (36.9 C), resp. rate 20, height 5\' 1"  (1.549 m), weight 42.6 kg, last menstrual period 07/05/2019, SpO2 100 %.  Physical Exam  Nursing note and vitals reviewed. Constitutional: She is oriented to person, place, and time.  She appears well-developed and well-nourished.  Cardiovascular: Normal rate.  Respiratory: Effort normal and breath sounds normal.  GI: Soft. Bowel sounds are normal. She exhibits no distension. There is no abdominal tenderness. There is no rebound, no guarding and no CVA tenderness.  Neurological: She is alert and oriented to person, place, and time.  Skin: Skin is warm and dry.  Psychiatric: She has a normal mood and affect. Her behavior is normal. Judgment and thought content normal.    MAU Course/MDM  Procedures  --Greater than 15 minutes spent at bedside discussing Cannabis Hyperemesis with patient and FOB. Patient denies recent [facility] use, acknowledges possibility of history of frequent  use. --Reviewed diet modifications to reduce episodes, ideal foods to try with focus on simple bland carbs --Small change to antiemetic regimen: begin B6+Unisom q 8 hours as primary intervention along with diet, Compazine and THC abstinence, use Phenergan for breakthrough  Patient Vitals for the past 24 hrs:  BP Temp Pulse Resp SpO2 Height Weight  09/13/19 2356 130/69 - 80 17 - - -  09/13/19 2250 (!) 142/84 - 97 - - - -  09/13/19 2031 (!) 142/92 - 80 - - - -  09/13/19 2011 - 98.4 F (36.9 C) (!) 114 20 100 % 5\' 1"  (1.549 m) 42.6 kg   Results for orders placed or performed during the hospital encounter of 09/13/19 (from the past 24 hour(s))  Urinalysis, Routine w reflex microscopic     Status: Abnormal   Collection Time: 09/13/19  8:31 PM  Result Value Ref Range   Color, Urine AMBER (A) YELLOW   APPearance CLOUDY (A) CLEAR   Specific Gravity, Urine 1.029 1.005 - 1.030   pH 6.0 5.0 - 8.0   Glucose, UA NEGATIVE NEGATIVE mg/dL   Hgb urine dipstick NEGATIVE NEGATIVE   Bilirubin Urine NEGATIVE NEGATIVE   Ketones, ur 80 (A) NEGATIVE mg/dL   Protein, ur 11/13/19 (A) NEGATIVE mg/dL   Nitrite NEGATIVE NEGATIVE   Leukocytes,Ua TRACE (A) NEGATIVE   RBC / HPF 6-10 0 - 5 RBC/hpf   WBC, UA 6-10 0 - 5 WBC/hpf   Bacteria, UA MANY (A) NONE SEEN   Squamous Epithelial / LPF 21-50 0 - 5   Mucus PRESENT   Rapid urine drug screen (hospital performed)     Status: Abnormal   Collection Time: 09/13/19  8:31 PM  Result Value Ref Range   Opiates NONE DETECTED NONE DETECTED   Cocaine NONE DETECTED NONE DETECTED   Benzodiazepines NONE DETECTED NONE DETECTED   Amphetamines NONE DETECTED NONE DETECTED   Tetrahydrocannabinol POSITIVE (A) NONE DETECTED   Barbiturates NONE DETECTED NONE DETECTED   Meds ordered this encounter  Medications  . lactated ringers bolus 1,000 mL  . ondansetron (ZOFRAN-ODT) disintegrating tablet 4 mg  . multivitamins adult (INFUVITE ADULT) 10 mL in lactated ringers 1,000 mL infusion   . haloperidol lactate (HALDOL) injection 5 mg  . diphenhydrAMINE (BENADRYL) injection 25 mg  . pyridOXINE (VITAMIN B-6) 25 MG tablet    Sig: Take 1 tablet (25 mg total) by mouth every 8 (eight) hours.    Dispense:  30 tablet    Refill:  0    Order Specific Question:   Supervising Provider    Answer:   11/13/19 [place] Bing  . doxylamine, Sleep, (UNISOM) 25 MG tablet    Sig: Take 1 tablet (25 mg total) by mouth every 8 (eight) hours as needed.    Dispense:  30 tablet    Refill:  0  Order Specific Question:   Supervising Provider    Answer:   [place] BingPICKENS, CHARLIE [[id number]]  . promethazine (PHENERGAN) 25 MG tablet    Sig: Take 1 tablet (25 mg total) by mouth every 6 (six) hours as needed for nausea or vomiting. For nausea and vomiting not resolved with B6 and Unisom    Dispense:  30 tablet    Refill:  0    Order Specific Question:   Supervising Provider    Answer:   [place] BingPICKENS, CHARLIE [[id number]]    Assessment and Plan  --28 y.o. G1P0000 at 8560w6d  --Ketonuria, s/p LR bolus and vitamin bag --Cannabis Hyperemesis. Tolerating PO prior to discharge --Discharge home in stable condition  F/U:  --Viability scan and [facility] intake Appt on 09/15/19  Calvert CantorSamantha C Nasim Garofano, CNM 09/14/2019, 12:51 AM

## 2019-09-13 NOTE — Discharge Instructions (Signed)
Cannabinoid Hyperemesis Syndrome °Cannabinoid hyperemesis syndrome (CHS) is a condition that causes repeated nausea, vomiting, and abdominal pain after long-term (chronic) use of marijuana (cannabis). People with CHS typically use marijuana 3-5 times a day for many years before they have symptoms, although it is possible to develop CHS with as little as 1 use per day. °Symptoms of CHS may be mild at first but can get worse and more frequent. In some cases, CHS may cause vomiting many times a day, which can lead to weight loss and dehydration. CHS may go away and come back many times (recur). People may not have symptoms or may otherwise be healthy in between CHS attacks. °What are the causes? °The exact cause of this condition is not known. Long-term use of marijuana may over-stimulate certain proteins in the brain that react with chemicals in marijuana (cannabinoid receptors). This over-stimulation may cause CHS. °What are the signs or symptoms? °Symptoms of this condition are often mild during the first few attacks, but they can get worse over time. Symptoms may include: °· Frequent nausea, especially early in the morning. °· Vomiting. °· Abdominal pain. °Taking several hot showers throughout the day can also be a sign of this condition. People with CHS may do this because it relieves symptoms. °How is this diagnosed? °This condition may be diagnosed based on: °· Your symptoms and medical history, including any drug use. °· A physical exam. °You may have tests done to rule out other problems. These tests may include: °· Blood tests. °· Urine tests. °· Imaging tests, such as an X-ray or CT scan. °How is this treated? °Treatment for this condition involves stopping marijuana use. Your health care provider may recommend: °· A drug rehabilitation program, if you have trouble stopping marijuana use. °· Medicines for nausea. °· Hot showers to help relieve symptoms. °Certain creams that contain a substance called  capsaicin may improve symptoms when applied to the abdomen. Ask your health care provider before starting any medicines or other treatments. °Severe nausea and vomiting may require you to stay at the hospital. You may need IV fluids to prevent or treat dehydration. You may also need certain medicines that must be given at the hospital. °Follow these instructions at home: °During an attack ° °· Stay in bed and rest in a dark, quiet room. °· Take anti-nausea medicine as told by your health care provider. °· Try taking hot showers to relieve your symptoms. °After an attack °· Drink small amounts of clear fluids slowly. Gradually add more. °· Once you are able to eat without vomiting, eat soft foods in small amounts every 3-4 hours. °General instructions ° °· Do not use any products that contain marijuana.If you need help quitting, ask your health care provider for resources and treatment options. °· Drink enough fluid to keep your urine pale yellow. Avoid drinking fluids that have a lot of sugar or caffeine, such as coffee and soda. °· Take and apply over-the-counter and prescription medicines only as told by your health care provider. Ask your health care provider before starting any new medicines or treatments. °· Keep all follow-up visits as told by your health care provider. This is important. °Contact a health care provider if: °· Your symptoms get worse. °· You cannot drink fluids without vomiting. °· You have pain and trouble swallowing after an attack. °Get help right away if: °· You cannot stop vomiting. °· You have blood in your vomit or your vomit looks like coffee grounds. °· You have   severe abdominal pain. °· You have stools that are bloody or black, or stools that look like tar. °· You have symptoms of dehydration, such as: °? Sunken eyes. °? Inability to make tears. °? Cracked lips. °? Dry mouth. °? Decreased urine production. °? Weakness. °? Sleepiness. °? Fainting. °Summary °· Cannabinoid hyperemesis  syndrome (CHS) is a condition that causes repeated nausea, vomiting, and abdominal pain after long-term use of marijuana. °· People with CHS typically use marijuana 3-5 times a day for many years before they have symptoms, although it is possible to develop CHS with as little as 1 use per day. °· Treatment for this condition involves stopping marijuana use. Hot showers and capsaicin creams may also help relieve symptoms. Ask your health care provider before starting any medicines or other treatments. °· Your health care provider may prescribe medicines to help with nausea. °· Get help right away if you have signs of dehydration, such as dry mouth, decreased urine production, or weakness. °This information is not intended to replace advice given to you by your health care provider. Make sure you discuss any questions you have with your health care provider. °Document Released: 03/06/2017 Document Revised: 04/04/2018 Document Reviewed: 03/06/2017 °Elsevier Patient Education © 2020 Elsevier Inc. ° °

## 2019-09-13 NOTE — MAU Note (Signed)
Pt here with N/V. Not able to keep anything down. Reports she has vomited 10x in the last 24 hours. Has nausea medication from previous visit, last took this morning. Does not help. Pt reports upper abdominal and lower back pain. This is an ongoing issue, but getting worse.

## 2019-09-14 ENCOUNTER — Telehealth: Payer: Self-pay | Admitting: Family Medicine

## 2019-09-14 DIAGNOSIS — O99321 Drug use complicating pregnancy, first trimester: Secondary | ICD-10-CM

## 2019-09-14 DIAGNOSIS — Z3A01 Less than 8 weeks gestation of pregnancy: Secondary | ICD-10-CM

## 2019-09-14 DIAGNOSIS — F1290 Cannabis use, unspecified, uncomplicated: Secondary | ICD-10-CM

## 2019-09-14 DIAGNOSIS — O219 Vomiting of pregnancy, unspecified: Secondary | ICD-10-CM

## 2019-09-14 NOTE — Telephone Encounter (Signed)
Called the patient to confirm the upcoming visit. The patient answered no to the covid screening questions. Also informed the patient of no children or visitors due to the covid restrictions. The patient verbalized understanding. °

## 2019-09-15 ENCOUNTER — Ambulatory Visit (INDEPENDENT_AMBULATORY_CARE_PROVIDER_SITE_OTHER): Payer: Medicaid Other | Admitting: Family Medicine

## 2019-09-15 ENCOUNTER — Other Ambulatory Visit: Payer: Self-pay

## 2019-09-15 ENCOUNTER — Ambulatory Visit (HOSPITAL_COMMUNITY)
Admission: RE | Admit: 2019-09-15 | Discharge: 2019-09-15 | Disposition: A | Discharge status: Home / Self Care | Payer: Medicaid Other | Source: Ambulatory Visit | Attending: Certified Nurse Midwife | Admitting: Certified Nurse Midwife

## 2019-09-15 DIAGNOSIS — O021 Missed abortion: Secondary | ICD-10-CM | POA: Diagnosis present

## 2019-09-15 DIAGNOSIS — O4690 Antepartum hemorrhage, unspecified, unspecified trimester: Secondary | ICD-10-CM | POA: Diagnosis present

## 2019-09-15 MED ORDER — MISOPROSTOL 200 MCG PO TABS: 800.0000 ug | ORAL_TABLET | Freq: Once | ORAL | 0 refills | Status: DC

## 2019-09-15 MED ORDER — IBUPROFEN 600 MG PO TABS: 600.0000 mg | ORAL_TABLET | Freq: Four times a day (QID) | ORAL | 1 refills | Status: DC | PRN

## 2019-09-15 MED ORDER — PROMETHAZINE HCL 25 MG PO TABS: 25.0000 mg | ORAL_TABLET | Freq: Four times a day (QID) | ORAL | 1 refills | Status: AC | PRN

## 2019-09-15 MED ORDER — ACETAMINOPHEN 500 MG PO TABS: 500.0000 mg | ORAL_TABLET | Freq: Four times a day (QID) | ORAL | 0 refills | Status: DC | PRN

## 2019-09-15 NOTE — Progress Notes (Signed)
Subjective:  Evelyn [name] is a 28 y.o. G1P0000 at [redacted]w[redacted]d being seen today for ongoing prenatal care.  She is currently monitored for the following issues for this low-risk pregnancy and has Intrauterine pregnancy; Hyperemesis gravidarum; and Substance use on their problem list.  Patient reports no complaints. Denies leaking of fluid.   The following portions of the patient's history were reviewed and updated as appropriate: allergies, current medications, past family history, past medical history, past social history, past surgical history and problem list. Problem list updated.  Objective:  There were no vitals filed for this visit.  Fetal Status: Ultrasound reviewed, seeing no fetal cardiac activity, meeting criteria for failed pregnancy.  General:  Alert, oriented and cooperative. Patient is in no acute distress.  Skin: Skin is warm and dry. No rash noted.   Cardiovascular: Normal heart rate noted  Respiratory: Normal respiratory effort, no problems with respiration noted  Abdomen: Soft, gravid, appropriate for gestational age.       Pelvic:       Cervical exam deferred        Extremities: Normal range of motion.     Mental Status: Normal mood and affect. Normal behavior. Normal judgment and thought content.    Assessment and Plan:  Pregnancy: G1P0000 at [redacted]w[redacted]d  1. Missed abortion with fetal demise before 70 completed weeks of gestation  Early Intrauterine Pregnancy Failure Protocol X  Documented intrauterine pregnancy failure less than or equal to [redacted] weeks gestation  X  No serious current illness  X  Baseline Hgb greater than or equal to 10g/dl  X  Patient has easily accessible transportation to the hospital  X  Clear preference  X  Practitioner/physician deems patient reliable  X  Counseling by practitioner or physician  X  Patient education by RN  X  Consent form signed       Rho-Gam given by RN if indicated  X  Medication dispensed  X  Cytotec 800 mcg Intravaginally by  patient at home       Intravaginally by NP in MAU       Rectally by patient at home       Rectally by RN in MAU  X   Ibuprofen 600 mg 1 tablet by mouth every 6 hours as needed #30 - prescribed  X   Tylenol #3 mg by mouth every 4 to 6 hours as needed - prescribed  X   Phenergan 12.5 mg by mouth every 4 hours as needed for nausea - prescribed  Reviewed with pt cytotec procedure.  Pt verbalizes that she lives close to the hospital and has transportation readily available.  Pt appears reliable and verbalizes understanding and agrees with plan of care  Follow up in 1-2 weeks, for bHCG  Mahathi Pokorney L, DO

## 2019-09-15 NOTE — Patient Instructions (Signed)
Managing Pregnancy Loss °Pregnancy loss can happen any time during a pregnancy. Often the cause is not known. It is rarely because of anything you did. Pregnancy loss in early pregnancy (during the first trimester) is called a miscarriage. This type of pregnancy loss is the most common. Pregnancy loss that happens after 20 weeks of pregnancy is called fetal demise if the baby's heart stops beating before birth. Fetal demise is much less common. Some women experience spontaneous labor shortly after fetal demise resulting in a stillborn birth (stillbirth). °Any pregnancy loss can be devastating. You will need to recover both physically and emotionally. Most women are able to get pregnant again after a pregnancy loss and deliver a healthy baby. °How to manage emotional recovery ° °Pregnancy loss is very hard emotionally. You may feel many different emotions while you grieve. You may feel sad and angry. You may also feel guilty. It is normal to have periods of crying. Emotional recovery can take longer than physical recovery. It is different for everyone. °Taking these steps can help you in managing this loss: °· Remember that it is unlikely you did anything to cause the pregnancy loss. °· Share your thoughts and feelings with friends, family, and your partner. Remember that your partner is also recovering emotionally. °· Make sure you have a good support system. Do not spend too much time alone. °· Meet with a pregnancy loss counselor or join a pregnancy loss support group. °· Get enough sleep and eat a healthy diet. Return to regular exercise when you have recovered physically. °· Do not use drugs or alcohol to manage your emotions. °· Consider seeing a mental health professional to help you recover emotionally. °· Ask a friend or loved one to help you decide what to do with any clothing and nursery items you received for your baby. °In the case of a stillbirth, many women benefit from taking additional steps in the  grieving process. You may want to: °· Hold your baby after the birth. °· Name your baby. °· Request a birth certificate. °· Create a keepsake such as handprints or footprints. °· Dress your baby and have a picture taken. °· Make funeral arrangements. °· Ask for a baptism or blessing. °Hospitals have staff members who can help you with all these arrangements. °How to recognize emotional stress °It is normal to have emotional stress after a pregnancy loss. But emotional stress that lasts a long time or becomes severe requires treatment. Watch out for these signs of severe emotional stress: °· Sadness, anger, or guilt that is not going away and is interfering with your normal activities. °· Relationship problems that have occurred or gotten worse since the pregnancy loss. °· Signs of depression that last longer than 2 weeks. These may include: °? Sadness. °? Anxiety. °? Hopelessness. °? Loss of interest in activities you enjoy. °? Inability to concentrate. °? Trouble sleeping or sleeping too much. °? Loss of appetite or overeating. °? Thoughts of death or of hurting yourself. °Follow these instructions at home: °· Take over-the-counter and prescription medicines only as told by your health care provider. °· Rest at home until your energy level returns. Return to your normal activities as told by your health care provider. Ask your health care provider what activities are safe for you. °· When you are ready, meet with your health care provider to discuss steps to take for a future pregnancy. °· Keep all follow-up visits as told by your health care provider. This is important. °  Where to find support °· To help you and your partner with the process of grieving, talk with your health care provider or seek counseling. °· Consider meeting with others who have experienced pregnancy loss. Ask your health care provider about support groups and resources. °Where to find more information °· [place] Department of Health and Human  Services Office on Women's Health: www.womenshealth.gov °· American Pregnancy Association: www.americanpregnancy.org °Contact a health care provider if: °· You continue to experience grief, sadness, or lack of motivation for everyday activities, and those feelings do not improve over time. °· You are struggling to recover emotionally, especially if you are using alcohol or substances to help. °Get help right away if: °· You have thoughts of hurting yourself or others. °If you ever feel like you may hurt yourself or others, or have thoughts about taking your own life, get help right away. You can go to your nearest emergency department or call: °· Your local emergency services (911 in the [place] °· A suicide crisis helpline, such as the National Suicide Prevention Lifeline at 1-800-273-8255. This is open 24 hours a day. °Summary °· Any pregnancy loss can be difficult physically and emotionally. °· You may experience many different emotions while you grieve. Emotional recovery can last longer than physical recovery. °· It is normal to have emotional stress after a pregnancy loss. But emotional stress that lasts a long time or becomes severe requires treatment. °· See your health care provider if you are struggling emotionally after a pregnancy loss. °This information is not intended to replace advice given to you by your health care provider. Make sure you discuss any questions you have with your health care provider. °Document Released: 02/06/2018 Document Revised: 03/18/2019 Document Reviewed: 02/06/2018 °Elsevier Patient Education © 2020 Elsevier Inc. ° °

## 2019-09-16 ENCOUNTER — Telehealth (INDEPENDENT_AMBULATORY_CARE_PROVIDER_SITE_OTHER): Payer: Medicaid Other | Admitting: Lactation Services

## 2019-09-16 DIAGNOSIS — Z3490 Encounter for supervision of normal pregnancy, unspecified, unspecified trimester: Secondary | ICD-10-CM

## 2019-09-16 NOTE — Telephone Encounter (Signed)
Called and spoke with pt in regards to her questions or concerns.   Pt was questioning about vaginal medication for Cytotec as she did not want a D&C. Discussed that The Cytotec is given to hopefully prevent pt from needing a D&C by stimulating the body to expel the pregnancy. Pt voiced understanding.   Clarified dosage with Kerry Hough, PA that dosage is usually 800 mg and that order should be 4 tablets.   [facility] in [place] to clarify order. Spoke with Diane to clarify that pt needs 4 tablets as a dosage. Pharmacy to also drop her med to fill for Promethazine as was put on hold for unknown reason.   Called pt back to let her know that I have spoken with there pharmacy to clarify her prescriptions. Pt voiced understanding.

## 2019-09-21 ENCOUNTER — Telehealth: Payer: Self-pay

## 2019-09-21 NOTE — Telephone Encounter (Signed)
Pt left message on nurse VM stating she received an email notifying her of new test results. She was unable to log into MyChart. Would like a call back to discuss results.

## 2019-09-22 ENCOUNTER — Telehealth: Payer: Self-pay | Admitting: *Deleted

## 2019-09-22 NOTE — Telephone Encounter (Signed)
Clarrissa called front desk and call transferred to front desk. She reports she can't eat, drink, rest, feels weak. Per chart she had failed pregnancy and was given cytotec which she took 09/16/19 am and reports had 2 days of dark red blood. States she is taking phenergan without relief. She denies dizziness or lightheadedness.  I discussed with Dr. Rip Harbour and per his recommendation I instructed Chancie to go to [facility]U for evaluation. She voices understanding.  Donnivan Villena,RN

## 2019-09-23 ENCOUNTER — Inpatient Hospital Stay (HOSPITAL_COMMUNITY): Payer: Medicaid Other

## 2019-09-23 ENCOUNTER — Inpatient Hospital Stay (HOSPITAL_COMMUNITY)
Admission: AD | Admit: 2019-09-23 | Discharge: 2019-09-25 | DRG: 770 | Disposition: A | Discharge status: Home / Self Care | Payer: Medicaid Other | Source: Ambulatory Visit | Attending: Obstetrics and Gynecology | Admitting: Obstetrics and Gynecology

## 2019-09-23 ENCOUNTER — Other Ambulatory Visit: Payer: Self-pay

## 2019-09-23 ENCOUNTER — Encounter (HOSPITAL_COMMUNITY): Payer: Self-pay

## 2019-09-23 DIAGNOSIS — O039 Complete or unspecified spontaneous abortion without complication: Secondary | ICD-10-CM

## 2019-09-23 DIAGNOSIS — O021 Missed abortion: Principal | ICD-10-CM | POA: Diagnosis present

## 2019-09-23 DIAGNOSIS — Z880 Allergy status to penicillin: Secondary | ICD-10-CM

## 2019-09-23 DIAGNOSIS — E876 Hypokalemia: Secondary | ICD-10-CM

## 2019-09-23 DIAGNOSIS — E349 Endocrine disorder, unspecified: Secondary | ICD-10-CM

## 2019-09-23 DIAGNOSIS — Z681 Body mass index (BMI) 19 or less, adult: Secondary | ICD-10-CM

## 2019-09-23 DIAGNOSIS — O99284 Endocrine, nutritional and metabolic diseases complicating childbirth: Secondary | ICD-10-CM | POA: Diagnosis present

## 2019-09-23 DIAGNOSIS — O99892 Other specified diseases and conditions complicating childbirth: Secondary | ICD-10-CM | POA: Diagnosis present

## 2019-09-23 DIAGNOSIS — E860 Dehydration: Secondary | ICD-10-CM | POA: Diagnosis present

## 2019-09-23 DIAGNOSIS — O211 Hyperemesis gravidarum with metabolic disturbance: Secondary | ICD-10-CM | POA: Diagnosis present

## 2019-09-23 DIAGNOSIS — O219 Vomiting of pregnancy, unspecified: Secondary | ICD-10-CM

## 2019-09-23 DIAGNOSIS — E890 Postprocedural hypothyroidism: Secondary | ICD-10-CM | POA: Diagnosis present

## 2019-09-23 DIAGNOSIS — Z3490 Encounter for supervision of normal pregnancy, unspecified, unspecified trimester: Secondary | ICD-10-CM

## 2019-09-23 DIAGNOSIS — R634 Abnormal weight loss: Secondary | ICD-10-CM | POA: Diagnosis present

## 2019-09-23 DIAGNOSIS — E871 Hypo-osmolality and hyponatremia: Secondary | ICD-10-CM | POA: Diagnosis present

## 2019-09-23 DIAGNOSIS — Z20828 Contact with and (suspected) exposure to other viral communicable diseases: Secondary | ICD-10-CM | POA: Diagnosis present

## 2019-09-23 DIAGNOSIS — E878 Other disorders of electrolyte and fluid balance, not elsewhere classified: Secondary | ICD-10-CM

## 2019-09-23 LAB — URINALYSIS, ROUTINE W REFLEX MICROSCOPIC
Glucose, UA: 150 mg/dL — AB
Hgb urine dipstick: NEGATIVE
Ketones, ur: 20 mg/dL — AB
Leukocytes,Ua: NEGATIVE
Nitrite: NEGATIVE
Protein, ur: 100 mg/dL — AB
Specific Gravity, Urine: 1.031 — ABNORMAL HIGH (ref 1.005–1.030)
pH: 6 (ref 5.0–8.0)

## 2019-09-23 LAB — CBC WITH DIFFERENTIAL/PLATELET
Abs Immature Granulocytes: 0.02 10*3/uL (ref 0.00–0.07)
Basophils Absolute: 0 10*3/uL (ref 0.0–0.1)
Basophils Relative: 0 %
Eosinophils Absolute: 0 10*3/uL (ref 0.0–0.5)
Eosinophils Relative: 0 %
HCT: 33.1 % — ABNORMAL LOW (ref 36.0–46.0)
Hemoglobin: 12 g/dL (ref 12.0–15.0)
Immature Granulocytes: 0 %
Lymphocytes Relative: 20 %
Lymphs Abs: 1.2 10*3/uL (ref 0.7–4.0)
MCH: 28.9 pg (ref 26.0–34.0)
MCHC: 36.3 g/dL — ABNORMAL HIGH (ref 30.0–36.0)
MCV: 79.8 fL — ABNORMAL LOW (ref 80.0–100.0)
Monocytes Absolute: 0.8 10*3/uL (ref 0.1–1.0)
Monocytes Relative: 13 %
Neutro Abs: 3.9 10*3/uL (ref 1.7–7.7)
Neutrophils Relative %: 67 %
Platelets: 162 10*3/uL (ref 150–400)
RBC: 4.15 MIL/uL (ref 3.87–5.11)
RDW: 11.6 % (ref 11.5–15.5)
WBC: 5.9 10*3/uL (ref 4.0–10.5)
nRBC: 0 % (ref 0.0–0.2)

## 2019-09-23 LAB — COMPREHENSIVE METABOLIC PANEL
ALT: 37 U/L (ref 0–44)
AST: 27 U/L (ref 15–41)
Albumin: 3.6 g/dL (ref 3.5–5.0)
Alkaline Phosphatase: 26 U/L — ABNORMAL LOW (ref 38–126)
Anion gap: 15 (ref 5–15)
BUN: 8 mg/dL (ref 6–20)
CO2: 24 mmol/L (ref 22–32)
Calcium: 9.2 mg/dL (ref 8.9–10.3)
Chloride: 92 mmol/L — ABNORMAL LOW (ref 98–111)
Creatinine, Ser: 0.53 mg/dL (ref 0.44–1.00)
GFR calc Af Amer: >60 mL/min (ref >60)
GFR calc non Af Amer: >60 mL/min (ref >60)
Glucose, Bld: 97 mg/dL (ref 70–99)
Potassium: 2.8 mmol/L — ABNORMAL LOW (ref 3.5–5.1)
Sodium: 131 mmol/L — ABNORMAL LOW (ref 135–145)
Total Bilirubin: 1.6 mg/dL — ABNORMAL HIGH (ref 0.3–1.2)
Total Protein: 6.9 g/dL (ref 6.5–8.1)

## 2019-09-23 LAB — TYPE AND SCREEN
ABO/RH(D): O POS
Antibody Screen: NEGATIVE

## 2019-09-23 LAB — WET PREP, GENITAL
Clue Cells Wet Prep HPF POC: NONE SEEN
Sperm: NONE SEEN
Trich, Wet Prep: NONE SEEN
Yeast Wet Prep HPF POC: NONE SEEN

## 2019-09-23 LAB — HCG, QUANTITATIVE, PREGNANCY: hCG, Beta Chain, Quant, S: 295585 m[IU]/mL — ABNORMAL HIGH (ref <5)

## 2019-09-23 MED ORDER — ACETAMINOPHEN 325 MG PO TABS: 650.0000 mg | ORAL_TABLET | ORAL | Status: DC | PRN

## 2019-09-23 MED ORDER — LACTATED RINGERS IV BOLUS
1000.0000 mL | Freq: Once | INTRAVENOUS | Status: AC
  Administered 2019-09-23: 16:00:00 1000 mL via INTRAVENOUS

## 2019-09-23 MED ORDER — ZOLPIDEM TARTRATE 5 MG PO TABS: 5.0000 mg | ORAL_TABLET | Freq: Every evening | ORAL | Status: DC | PRN

## 2019-09-23 MED ORDER — KCL IN DEXTROSE-NACL 40-5-0.9 MEQ/L-%-% IV SOLN
INTRAVENOUS | Status: DC
  Filled 2019-09-23: qty 1000

## 2019-09-23 MED ORDER — PROMETHAZINE HCL 25 MG/ML IJ SOLN: 12.5000 mg | INTRAMUSCULAR | Status: DC | PRN

## 2019-09-23 MED ORDER — DOCUSATE SODIUM 100 MG PO CAPS
100.0000 mg | ORAL_CAPSULE | Freq: Every day | ORAL | Status: DC
  Administered 2019-09-24: 11:00:00 100 mg via ORAL
  Filled 2019-09-23: qty 1

## 2019-09-23 MED ORDER — SODIUM CHLORIDE 0.9 % IV SOLN
8.0000 mg | Freq: Once | INTRAVENOUS | Status: AC
  Administered 2019-09-23: 17:00:00 8 mg via INTRAVENOUS
  Filled 2019-09-23: qty 4

## 2019-09-23 MED ORDER — KCL-LACTATED RINGERS-D5W 20 MEQ/L IV SOLN
INTRAVENOUS | Status: DC
  Administered 2019-09-23: 200 mL/h via INTRAVENOUS
  Administered 2019-09-24 (×3): via INTRAVENOUS
  Administered 2019-09-25: 125 mL/h via INTRAVENOUS
  Filled 2019-09-23 (×6): qty 1000

## 2019-09-23 MED ORDER — POTASSIUM CHLORIDE 10 MEQ/100ML IV SOLN
10.0000 meq | INTRAVENOUS | Status: AC
  Administered 2019-09-23 (×3): 10 meq via INTRAVENOUS
  Filled 2019-09-23 (×3): qty 100

## 2019-09-23 MED ORDER — CALCIUM CARBONATE ANTACID 500 MG PO CHEW: 2.0000 | CHEWABLE_TABLET | ORAL | Status: DC | PRN

## 2019-09-23 MED ORDER — PRENATAL MULTIVITAMIN CH
1.0000 | ORAL_TABLET | Freq: Every day | ORAL | Status: DC
  Administered 2019-09-24: 1 via ORAL
  Filled 2019-09-23: qty 1

## 2019-09-23 MED ORDER — PROMETHAZINE HCL 12.5 MG RE SUPP
12.5000 mg | Freq: Four times a day (QID) | RECTAL | Status: DC | PRN
  Filled 2019-09-23: qty 1

## 2019-09-23 MED ORDER — FAMOTIDINE IN NACL 20-0.9 MG/50ML-% IV SOLN
20.0000 mg | Freq: Once | INTRAVENOUS | Status: AC
  Administered 2019-09-23: 16:00:00 20 mg via INTRAVENOUS
  Filled 2019-09-23: qty 50

## 2019-09-23 NOTE — MAU Note (Signed)
Pt is a G1P0 s/p SAB with cytotec on 10/07.  Prior to SAB pt was experiencing N&V, with inability to keep down food or water and frequent hypersalivation.  Today pt is still experiencing N&V and reports that she has not kept food or water down since prior to SAB and has been seen in MAU several times for N&V with little to no relief.  Pt states she has been given medication but nothing has worked.
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
## 2019-09-23 NOTE — H&P (Signed)
This 28 yr female, G1P0000 at [redacted]w[redacted]d is admitted for hydration antiemetics, and correction of electrolyte imbalances in anticipation of Suction D&C.  Evelyn [name] is a 28 y.o. Marland Kitchen  1 Fetal heart rate is absent on today's u/s, and Q HCG is markedly elevated at 295,000. U/S DOES NOT show sonographic evidence of molar pregnancy, only evidence of nonviable pregnancy or of multiple gestation, but hCG dramatically elevated.  BMET    Component Value Date/Time   NA 131 (L) 09/23/2019 1532   K 2.8 (L) 09/23/2019 1532   CL 92 (L) 09/23/2019 1532   CO2 24 09/23/2019 1532   GLUCOSE 97 09/23/2019 1532   BUN 8 09/23/2019 1532   CREATININE 0.53 09/23/2019 1532   CALCIUM 9.2 09/23/2019 1532   GFRNONAA >60 09/23/2019 1532   GFRAA >60 09/23/2019 1532    History of Present Illness: Severe hyperemesis, with weight loss from 115-90 per patient report. Patient reports  vaginal bleeding as none. Patient describes fluid per vagina as None.  Patient Active Problem List   Diagnosis Date Noted  . Hypokalemia due to excessive gastrointestinal loss of potassium 09/23/2019  . Dehydration with hyponatremia 09/23/2019  . Hyperemesis gravidarum before end of [redacted] week gestation, dehydration 09/23/2019  . Intrauterine pregnancy 09/13/2019  . Hyperemesis gravidarum 09/13/2019  . Substance use 09/13/2019   Past Medical History: Past Medical History:  Diagnosis Date  . GERD (gastroesophageal reflux disease)   . Hyperthyroidism     Past Surgical History: Past Surgical History:  Procedure Laterality Date  . THYROIDECTOMY    . TONSILLECTOMY    . UPPER GI ENDOSCOPY      Obstetrical History: OB History    Gravida  1   Para  0   Term  0   Preterm  0   AB  0   Living  0     SAB  0   TAB  0   Ectopic  0   Multiple  0   Live Births              Gynecological History: negative  Social History: Social History   Socioeconomic History  . Marital status: Single    Spouse name: Not on  file  . Number of children: Not on file  . Years of education: Not on file  . Highest education level: Not on file  Occupational History  . Not on file  Social Needs  . Financial resource strain: Not on file  . Food insecurity    Worry: Not on file    Inability: Not on file  . Transportation needs    Medical: Not on file    Non-medical: Not on file  Tobacco Use  . Smoking status: Never Smoker  . Smokeless tobacco: Never Used  Substance and Sexual Activity  . Alcohol use: Not Currently  . Drug use: Yes    Types: Marijuana    Comment: quit when found out pregnant  . Sexual activity: Yes    Comment: depo shot 3 months ago  Lifestyle  . Physical activity    Days per week: Not on file    Minutes per session: Not on file  . Stress: Not on file  Relationships  . Social Musician on phone: Not on file    Gets together: Not on file    Attends religious service: Not on file    Active member of club or organization: Not on file    Attends meetings of clubs  or organizations: Not on file    Relationship status: Not on file  Other Topics Concern  . Not on file  Social History Narrative  . Not on file    Family History: History reviewed. No pertinent family history.  Allergies: Allergies  Allergen Reactions  . Penicillins Other (See Comments)    Pt reports "tongue swelling."    Medications Prior to Admission  Medication Sig Dispense Refill Last Dose  . acetaminophen (TYLENOL) 500 MG tablet Take 1 tablet (500 mg total) by mouth every 6 (six) hours as needed. 30 tablet 0   . Cholecalciferol (VITAMIN D-3 PO) Take 1 capsule by mouth daily.     Marland Kitchen doxylamine, Sleep, (UNISOM) 25 MG tablet Take 1 tablet (25 mg total) by mouth every 8 (eight) hours as needed. 30 tablet 0   . ibuprofen (ADVIL) 600 MG tablet Take 1 tablet (600 mg total) by mouth every 6 (six) hours as needed. 30 tablet 1   . misoprostol (CYTOTEC) 200 MCG tablet Place 4 tablets (800 mcg total) vaginally  once for 1 dose. 3 tablet 0   . Multiple Vitamins-Minerals (MULTIVITAMIN PO) Take 1 tablet by mouth daily.     . potassium chloride (K-DUR) 10 MEQ tablet Take 1 tablet (10 mEq total) by mouth daily. 4 tablet 0   . Probiotic Product (PROBIOTIC PO) Take 1 capsule by mouth once.     . prochlorperazine (COMPAZINE) 10 MG tablet Take 1 tablet (10 mg total) by mouth 2 (two) times daily as needed for nausea or vomiting. 20 tablet 0   . promethazine (PHENERGAN) 25 MG tablet Take 1 tablet (25 mg total) by mouth every 6 (six) hours as needed for nausea or vomiting. For nausea and vomiting not resolved with B6 and Unisom 30 tablet 0   . promethazine (PHENERGAN) 25 MG tablet Take 1 tablet (25 mg total) by mouth every 6 (six) hours as needed for nausea or vomiting. 30 tablet 1   . pyridOXINE (VITAMIN B-6) 25 MG tablet Take 1 tablet (25 mg total) by mouth every 8 (eight) hours. 30 tablet 0     Review of Systems  Review of Systems  Constitutional: Negative for fever, chills, weight loss, malaise/fatigue and diaphoresis.  HENT: Negative for hearing loss, ear pain, nosebleeds, congestion, sore throat, neck pain, tinnitus and ear discharge.   Eyes: Negative for blurred vision, double vision, photophobia, pain, discharge and redness.  Respiratory: Negative for cough, hemoptysis, sputum production, shortness of breath, wheezing and stridor.   Cardiovascular: Negative for chest pain, palpitations, orthopnea, claudication, leg swelling and PND.  Gastrointestinal:negative for abdominal pain Negative for heartburn, nausea, vomiting, diarrhea, constipation, blood in stool and melena.  Genitourinary: Negative for dysuria, urgency, frequency, hematuria and flank pain.  Musculoskeletal: Negative for myalgias, back pain, joint pain and falls.  Skin: Negative for itching and rash.  Neurological: Negative for dizziness, tingling, tremors, sensory change, speech change, focal weakness, seizures, loss of consciousness, weakness  and headaches.  Endo/Heme/Allergies: Negative for environmental allergies and polydipsia. Does not bruise/bleed easily.  Psychiatric/Behavioral: Negative for depression, suicidal ideas, hallucinations, memory loss and substance abuse. The patient is not nervous/anxious and does not have insomnia.     Vitals:  Blood pressure 127/83, pulse 83, temperature 98.9 F (37.2 C), temperature source Oral, resp. rate 14, height 5\' 1"  (1.549 m), weight 40.6 kg, last menstrual period 07/05/2019, SpO2 100 %. Physical Examination:  General appearance - alert, well appearing, and in no distress, oriented to person, place, and time,  in mild to moderate distress, ill-appearing, chronically ill appearing and gaunt. Mental status - alert, oriented to person, place, and time Eyes - pupils equal and reactive, extraocular eye movements intact Chest - clear to auscultation, no wheezes, rales or rhonchi, symmetric air entry Heart - normal rate and regular rhythm Abdomen - soft, nontender, nondistended, no masses or organomegaly Pelvic - u/s report below. Extremities - no pedal edema noted Abdomen: non-tender and fundal height  is not palpable Pelvic Exam: Extremities: extremities normal, atraumatic, no cyanosis or edema  Membranes:intact Fetal Monitoring:Absent FHR on u/s   Labs:  Results for orders placed or performed during the hospital encounter of 09/23/19 (from the past 24 hour(s))  hCG, quantitative, pregnancy   Collection Time: 09/23/19  3:32 PM  Result Value Ref Range   hCG, Beta Chain, Quant, S 295,585 (H) <5 mIU/mL  CBC with Differential/Platelet   Collection Time: 09/23/19  3:32 PM  Result Value Ref Range   WBC 5.9 4.0 - 10.5 K/uL   RBC 4.15 3.87 - 5.11 MIL/uL   Hemoglobin 12.0 12.0 - 15.0 g/dL   HCT 16.1 (L) 09.6 - 04.5 %   MCV 79.8 (L) 80.0 - 100.0 fL   MCH 28.9 26.0 - 34.0 pg   MCHC 36.3 (H) 30.0 - 36.0 g/dL   RDW 40.9 81.1 - 91.4 %   Platelets 162 150 - 400 K/uL   nRBC 0.0 0.0 - 0.2 %    Neutrophils Relative % 67 %   Neutro Abs 3.9 1.7 - 7.7 K/uL   Lymphocytes Relative 20 %   Lymphs Abs 1.2 0.7 - 4.0 K/uL   Monocytes Relative 13 %   Monocytes Absolute 0.8 0.1 - 1.0 K/uL   Eosinophils Relative 0 %   Eosinophils Absolute 0.0 0.0 - 0.5 K/uL   Basophils Relative 0 %   Basophils Absolute 0.0 0.0 - 0.1 K/uL   Immature Granulocytes 0 %   Abs Immature Granulocytes 0.02 0.00 - 0.07 K/uL  Comprehensive metabolic panel   Collection Time: 09/23/19  3:32 PM  Result Value Ref Range   Sodium 131 (L) 135 - 145 mmol/L   Potassium 2.8 (L) 3.5 - 5.1 mmol/L   Chloride 92 (L) 98 - 111 mmol/L   CO2 24 22 - 32 mmol/L   Glucose, Bld 97 70 - 99 mg/dL   BUN 8 6 - 20 mg/dL   Creatinine, Ser 7.82 0.44 - 1.00 mg/dL   Calcium 9.2 8.9 - 95.6 mg/dL   Total Protein 6.9 6.5 - 8.1 g/dL   Albumin 3.6 3.5 - 5.0 g/dL   AST 27 15 - 41 U/L   ALT 37 0 - 44 U/L   Alkaline Phosphatase 26 (L) 38 - 126 U/L   Total Bilirubin 1.6 (H) 0.3 - 1.2 mg/dL   GFR calc non Af Amer >60 >60 mL/min   GFR calc Af Amer >60 >60 mL/min   Anion gap 15 5 - 15  Wet prep, genital   Collection Time: 09/23/19  3:58 PM   Specimen: Vaginal  Result Value Ref Range   Yeast Wet Prep HPF POC NONE SEEN NONE SEEN   Trich, Wet Prep NONE SEEN NONE SEEN   Clue Cells Wet Prep HPF POC NONE SEEN NONE SEEN   WBC, Wet Prep HPF POC FEW (A) NONE SEEN   Sperm NONE SEEN   Urinalysis, Routine w reflex microscopic   Collection Time: 09/23/19  3:58 PM  Result Value Ref Range   Color, Urine AMBER (A)  YELLOW   APPearance HAZY (A) CLEAR   Specific Gravity, Urine 1.031 (H) 1.005 - 1.030   pH 6.0 5.0 - 8.0   Glucose, UA 150 (A) NEGATIVE mg/dL   Hgb urine dipstick NEGATIVE NEGATIVE   Bilirubin Urine SMALL (A) NEGATIVE   Ketones, ur 20 (A) NEGATIVE mg/dL   Protein, ur 960100 (A) NEGATIVE mg/dL   Nitrite NEGATIVE NEGATIVE   Leukocytes,Ua NEGATIVE NEGATIVE   RBC / HPF 0-5 0 - 5 RBC/hpf   WBC, UA 0-5 0 - 5 WBC/hpf   Bacteria, UA FEW (A) NONE  SEEN   Squamous Epithelial / LPF 11-20 0 - 5   Mucus PRESENT    Hyaline Casts, UA PRESENT   Type and screen [facility]   Collection Time: 09/23/19  8:10 PM  Result Value Ref Range   ABO/RH(D) O POS    Antibody Screen NEG    Sample Expiration      09/26/2019,2359 Performed at [facility], [address] [place], [place] [address]     Imaging Studies: [place] Ob Transvaginal  Result Date: 09/23/2019 CLINICAL DATA:  Patient diagnosed with a miscarriage on 09/15/2019. Given site attack on 09/16/2019. Nausea and vomiting. Vaginal spotting. Patient should be 9 weeks and 1 day pregnant based on her last ultrasound, which is dated 09/02/2019. EXAM: TRANSVAGINAL OB ULTRASOUND TECHNIQUE: Transvaginal ultrasound was performed for complete evaluation of the gestation as well as the maternal uterus, adnexal regions, and pelvic cul-de-sac. COMPARISON:  09/15/2019 and 09/02/2019. FINDINGS: Intrauterine gestational sac: Irregular gestational sac. Yolk sac:  Not Visualized. Embryo:  Visualized. Cardiac Activity: Not Visualized. Heart Rate: Not detectable. CRL:   12.5 mm   7 w 3 d                  US EDC: 05/08/2020 Subchorionic hemorrhage:  None visualized. Maternal uterus/adnexae: No uterine masses. Cervix appears closed. Normal right ovary. Left ovary not visualized. No adnexal masses. No pelvic free fluid. IMPRESSION: 1. Failed intrauterine pregnancy. Embryo visualized with a crown-rump length of 12.5 mm, measuring 15.1 mm on the most recent prior ultrasound. No detectable fetal cardiac activity or heart rate. Relatively large and irregular gestational sac. Electronically Signed   By: Amie Portlandavid  Ormond M.D.   On: 09/23/2019 18:51   [place] Ob Transvaginal  Result Date: 09/15/2019 CLINICAL DATA:  Vaginal bleeding during early pregnancy, bradycardia on prior exam EXAM: TRANSVAGINAL OB ULTRASOUND TECHNIQUE: Transvaginal ultrasound was performed for complete evaluation of the gestation as well as  the maternal uterus, adnexal regions, and pelvic cul-de-sac. COMPARISON:  09/02/2019 FINDINGS: Intrauterine gestational sac: Present, single, slightly irregular, less irregular than on prior exam Yolk sac:  Present Embryo:  Present Cardiac Activity: None identified Heart Rate: N/A bpm CRL:   15.1 mm   7 w 6 d                  US EDC: Subchorionic hemorrhage:  None visualized. Maternal uterus/adnexae: Uterus otherwise normal appearance RIGHT ovary normal size and morphology, 2.3 x 1.3 x 1.4 cm. LEFT ovary normal size and morphology, 1.9 x 1.2 x 1.7 cm. No free pelvic fluid or adnexal masses. IMPRESSION: Single intrauterine gestation identified with crown-rump length 15.1 mm. No fetal cardiac activity is seen. Findings meet definitive criteria for failed pregnancy. This follows SRU consensus guidelines: Diagnostic Criteria for Nonviable Pregnancy Early in the First Trimester. Macy Mis Engl J Med [phone];369:1443-51. Electronically Signed   By: Ulyses SouthwardMark  Boles M.D.   On: 09/15/2019  10:06   US Ob Less Than 14 Weeks With Ob Transvaginal  Result Date: 09/02/2019 CLINICAL DATA:  Vaginal bleeding EXAM: OBSTETRIC <14 WK [place] AND TRANSVAGINAL OB US TECHNIQUE: Both transabdominal and transvaginal ultrasound examinations were performed for complete evaluation of the gestation as well as the maternal uterus, adnexal regions, and pelvic cul-de-sac. Transvaginal technique was performed to assess early pregnancy. COMPARISON:  None. FINDINGS: Intrauterine gestational sac: Single Yolk sac:  Visualized. Embryo:  Visualized. Cardiac Activity: Visualized. Heart Rate: 102 bpm CRL: 4.5  mm   6 w   1 d                  [place] EDC: 04/26/2020 Subchorionic hemorrhage:  None visualized. Maternal uterus/adnexae: Normal ovaries. No adnexal mass. No pelvic free fluid. IMPRESSION: 1. Single live intrauterine pregnancy as detailed above. 2. Heart rate is relatively decreased at 102 beats per minute. Recommend follow-up pelvic ultrasound in 1-2 weeks.  Electronically Signed   By: Elige Ko   On: 09/02/2019 15:47    . [START ON 09/24/2019] docusate sodium  100 mg Oral Daily  . [START ON 09/24/2019] prenatal multivitamin  1 tablet Oral Q1200   I have reviewed the patient's current medications.   ASSESSMENT: Patient Active Problem List   Diagnosis Date Noted  . Hypokalemia due to excessive gastrointestinal loss of potassium 09/23/2019  . Dehydration with hyponatremia 09/23/2019  . Hyperemesis gravidarum before end of [redacted] week gestation, dehydration 09/23/2019  . Intrauterine pregnancy 09/13/2019  . Hyperemesis gravidarum 09/13/2019  . Substance use 09/13/2019

## 2019-09-23 NOTE — MAU Note (Signed)
Dx with miscarriage 10/6.  Took the 4 pills on 10/7.  Has been having vomiting and some diarrhea since. Was given medication for the n/v. Has had a little spotting, no real bleeding, no blood clots. cramping

## 2019-09-24 DIAGNOSIS — O99284 Endocrine, nutritional and metabolic diseases complicating childbirth: Secondary | ICD-10-CM | POA: Diagnosis present

## 2019-09-24 DIAGNOSIS — E860 Dehydration: Secondary | ICD-10-CM | POA: Diagnosis present

## 2019-09-24 DIAGNOSIS — E890 Postprocedural hypothyroidism: Secondary | ICD-10-CM | POA: Diagnosis present

## 2019-09-24 DIAGNOSIS — E871 Hypo-osmolality and hyponatremia: Secondary | ICD-10-CM | POA: Diagnosis present

## 2019-09-24 DIAGNOSIS — Z681 Body mass index (BMI) 19 or less, adult: Secondary | ICD-10-CM | POA: Diagnosis not present

## 2019-09-24 DIAGNOSIS — O021 Missed abortion: Secondary | ICD-10-CM | POA: Diagnosis present

## 2019-09-24 DIAGNOSIS — O99892 Other specified diseases and conditions complicating childbirth: Secondary | ICD-10-CM | POA: Diagnosis present

## 2019-09-24 DIAGNOSIS — Z880 Allergy status to penicillin: Secondary | ICD-10-CM | POA: Diagnosis not present

## 2019-09-24 DIAGNOSIS — R634 Abnormal weight loss: Secondary | ICD-10-CM | POA: Diagnosis present

## 2019-09-24 DIAGNOSIS — O211 Hyperemesis gravidarum with metabolic disturbance: Secondary | ICD-10-CM | POA: Diagnosis present

## 2019-09-24 DIAGNOSIS — E876 Hypokalemia: Secondary | ICD-10-CM | POA: Diagnosis present

## 2019-09-24 DIAGNOSIS — Z20828 Contact with and (suspected) exposure to other viral communicable diseases: Secondary | ICD-10-CM | POA: Diagnosis present

## 2019-09-24 LAB — COMPREHENSIVE METABOLIC PANEL
ALT: 26 U/L (ref 0–44)
AST: 17 U/L (ref 15–41)
Albumin: 2.6 g/dL — ABNORMAL LOW (ref 3.5–5.0)
Alkaline Phosphatase: 19 U/L — ABNORMAL LOW (ref 38–126)
Anion gap: 6 (ref 5–15)
BUN: 5 mg/dL — ABNORMAL LOW (ref 6–20)
CO2: 24 mmol/L (ref 22–32)
Calcium: 8.2 mg/dL — ABNORMAL LOW (ref 8.9–10.3)
Chloride: 101 mmol/L (ref 98–111)
Creatinine, Ser: 0.47 mg/dL (ref 0.44–1.00)
GFR calc Af Amer: >60 mL/min (ref >60)
GFR calc non Af Amer: >60 mL/min (ref >60)
Glucose, Bld: 126 mg/dL — ABNORMAL HIGH (ref 70–99)
Potassium: 3.5 mmol/L (ref 3.5–5.1)
Sodium: 131 mmol/L — ABNORMAL LOW (ref 135–145)
Total Bilirubin: 0.9 mg/dL (ref 0.3–1.2)
Total Protein: 4.7 g/dL — ABNORMAL LOW (ref 6.5–8.1)

## 2019-09-24 LAB — CULTURE, OB URINE: Culture: NO GROWTH

## 2019-09-24 LAB — CBC
HCT: 26 % — ABNORMAL LOW (ref 36.0–46.0)
Hemoglobin: 9.4 g/dL — ABNORMAL LOW (ref 12.0–15.0)
MCH: 29.4 pg (ref 26.0–34.0)
MCHC: 36.2 g/dL — ABNORMAL HIGH (ref 30.0–36.0)
MCV: 81.3 fL (ref 80.0–100.0)
Platelets: 133 10*3/uL — ABNORMAL LOW (ref 150–400)
RBC: 3.2 MIL/uL — ABNORMAL LOW (ref 3.87–5.11)
RDW: 11.7 % (ref 11.5–15.5)
WBC: 4.6 10*3/uL (ref 4.0–10.5)
nRBC: 0 % (ref 0.0–0.2)

## 2019-09-24 LAB — SARS CORONAVIRUS 2 (TAT 6-24 HRS): SARS Coronavirus 2: NEGATIVE

## 2019-09-24 MED ORDER — TRANEXAMIC ACID-NACL 1000-0.7 MG/100ML-% IV SOLN
1000.0000 mg | INTRAVENOUS | Status: AC
  Administered 2019-09-25: 1000 mg via INTRAVENOUS
  Filled 2019-09-24 (×3): qty 100

## 2019-09-24 NOTE — Telephone Encounter (Signed)
Upon chart review, pt admitted to the hospital for [facility]. Concerns addressed.

## 2019-09-24 NOTE — Progress Notes (Signed)
Covid swab sent to lab. Will be resulted in 6-24 hours per microbiology, Dr. Glo Herring notified.

## 2019-09-24 NOTE — Progress Notes (Signed)
INitial visit with Evelyn [name] and her partner to offer spiritual support on the loss of her baby.  Evelyn [name] shared that she went to her regular appointment and then learned that the baby did not have a heartbeat.  Evelyn [name] reports that she's doing okay and has good support.  She learned that her mother and mother in law have both experienced early losses and is grateful not to have to do this alone.    Please page as further needs arise.  Donald Prose. Elyn Peers, M.Div. [facility] Chaplain Pager [phone] Office [phone]

## 2019-09-24 NOTE — Progress Notes (Signed)
Patient ID: Evelyn [name], female   DOB: [birth year], 28 y.o.   MRN: [id number] OB Attending  Pt has not been COVID 19 tested. Test has been ordered. Will postpone surgery until tomorrow. Pt made aware of postponement. Will allow pt to eat. Labs better. Continue with IV fluids. NPO after midnight. Surgery tomorrow morning at 9. TXA on call to OR. R/B of procedure reviewed with pt.

## 2019-09-24 NOTE — Progress Notes (Signed)
Hospital Day 1 s/p admission for hyperemesis, with nonviable pregnancy [redacted]w[redacted]d   Subjective: Has finally voided this a.m.  Will reduce IVF to 125/hr. Patient reports nausea and no problems voiding.    Objective: I have reviewed patient's vital signs, labs and am labs still pending.  General: alert, cooperative and fatigued GI: soft, non-tender; bowel sounds normal; no masses,  no organomegaly Vaginal Bleeding: none   Assessment/Plan: Hyperemesis and weight loss , dehydration associated with nonviable pregnancy with atypically high HCG's, 295,000.  Rule out Molar Pregnancy  Consented for suction Dilation and Curettage Awaiting morning labs to assess electrolyte status. OR Main notified of requested case, pending labs. TYPE and Screen in place.  LOS: 1 day    Jonnie Kind 09/24/2019, 6:44 AM

## 2019-09-25 ENCOUNTER — Inpatient Hospital Stay (HOSPITAL_COMMUNITY): Payer: Medicaid Other | Admitting: Certified Registered Nurse Anesthetist

## 2019-09-25 ENCOUNTER — Encounter (HOSPITAL_COMMUNITY)
Admission: AD | Disposition: A | Discharge status: Home / Self Care | Payer: Self-pay | Source: Ambulatory Visit | Attending: Obstetrics and Gynecology

## 2019-09-25 DIAGNOSIS — O021 Missed abortion: Secondary | ICD-10-CM | POA: Diagnosis not present

## 2019-09-25 HISTORY — PX: DILATION AND CURETTAGE OF UTERUS: SHX78

## 2019-09-25 LAB — CBC
HCT: 25.6 % — ABNORMAL LOW (ref 36.0–46.0)
Hemoglobin: 9 g/dL — ABNORMAL LOW (ref 12.0–15.0)
MCH: 29 pg (ref 26.0–34.0)
MCHC: 35.2 g/dL (ref 30.0–36.0)
MCV: 82.6 fL (ref 80.0–100.0)
Platelets: 138 10*3/uL — ABNORMAL LOW (ref 150–400)
RBC: 3.1 MIL/uL — ABNORMAL LOW (ref 3.87–5.11)
RDW: 11.9 % (ref 11.5–15.5)
WBC: 4.7 10*3/uL (ref 4.0–10.5)
nRBC: 0 % (ref 0.0–0.2)

## 2019-09-25 SURGERY — DILATION AND CURETTAGE
Anesthesia: General | Site: Vagina

## 2019-09-25 MED ORDER — DOXYCYCLINE HYCLATE 100 MG IV SOLR
200.0000 mg | INTRAVENOUS | Status: AC
  Administered 2019-09-25: 200 mg via INTRAVENOUS
  Filled 2019-09-25: qty 200

## 2019-09-25 MED ORDER — LACTATED RINGERS IV SOLN
INTRAVENOUS | Status: DC
  Administered 2019-09-25: 09:00:00 via INTRAVENOUS

## 2019-09-25 MED ORDER — SCOPOLAMINE 1 MG/3DAYS TD PT72
MEDICATED_PATCH | TRANSDERMAL | Status: DC | PRN
  Administered 2019-09-25: 1 via TRANSDERMAL

## 2019-09-25 MED ORDER — SUCCINYLCHOLINE 20MG/ML (10ML) SYRINGE FOR MEDFUSION PUMP - OPTIME
INTRAMUSCULAR | Status: DC | PRN
  Administered 2019-09-25: 100 mg via INTRAVENOUS

## 2019-09-25 MED ORDER — FENTANYL CITRATE (PF) 250 MCG/5ML IJ SOLN
INTRAMUSCULAR | Status: DC | PRN
  Administered 2019-09-25: 50 ug via INTRAVENOUS
  Administered 2019-09-25: 100 ug via INTRAVENOUS

## 2019-09-25 MED ORDER — METOCLOPRAMIDE HCL 5 MG/ML IJ SOLN: 10.0000 mg | Freq: Once | INTRAMUSCULAR | Status: DC | PRN

## 2019-09-25 MED ORDER — PROPOFOL 10 MG/ML IV BOLUS
INTRAVENOUS | Status: AC
  Filled 2019-09-25: qty 20

## 2019-09-25 MED ORDER — FENTANYL CITRATE (PF) 250 MCG/5ML IJ SOLN
INTRAMUSCULAR | Status: AC
  Filled 2019-09-25: qty 5

## 2019-09-25 MED ORDER — OXYCODONE HCL 5 MG PO TABS: 5.0000 mg | ORAL_TABLET | Freq: Four times a day (QID) | ORAL | 0 refills | Status: AC | PRN

## 2019-09-25 MED ORDER — FENTANYL CITRATE (PF) 100 MCG/2ML IJ SOLN: 25.0000 ug | INTRAMUSCULAR | Status: DC | PRN

## 2019-09-25 MED ORDER — IBUPROFEN 800 MG PO TABS: 800.0000 mg | ORAL_TABLET | Freq: Three times a day (TID) | ORAL | 0 refills | Status: AC | PRN

## 2019-09-25 MED ORDER — SODIUM CHLORIDE 0.9 % IR SOLN
Status: DC | PRN
  Administered 2019-09-25: 500 mL

## 2019-09-25 MED ORDER — PROPOFOL 10 MG/ML IV BOLUS
INTRAVENOUS | Status: DC | PRN
  Administered 2019-09-25: 200 mg via INTRAVENOUS

## 2019-09-25 MED ORDER — MEPERIDINE HCL 25 MG/ML IJ SOLN: 6.2500 mg | INTRAMUSCULAR | Status: DC | PRN

## 2019-09-25 MED ORDER — LABETALOL HCL 5 MG/ML IV SOLN
INTRAVENOUS | Status: AC
  Administered 2019-09-25: 5 mg via INTRAVENOUS
  Filled 2019-09-25: qty 4

## 2019-09-25 MED ORDER — LABETALOL HCL 5 MG/ML IV SOLN
5.0000 mg | INTRAVENOUS | Status: AC | PRN
  Administered 2019-09-25 (×2): 5 mg via INTRAVENOUS

## 2019-09-25 MED ORDER — DEXAMETHASONE SODIUM PHOSPHATE 10 MG/ML IJ SOLN
INTRAMUSCULAR | Status: DC | PRN
  Administered 2019-09-25: 10 mg via INTRAVENOUS

## 2019-09-25 MED ORDER — MISOPROSTOL 100 MCG PO TABS
ORAL_TABLET | ORAL | Status: DC | PRN
  Administered 2019-09-25: 200 ug via RECTAL

## 2019-09-25 MED ORDER — MIDAZOLAM HCL 2 MG/2ML IJ SOLN
INTRAMUSCULAR | Status: DC | PRN
  Administered 2019-09-25: 2 mg via INTRAVENOUS

## 2019-09-25 MED ORDER — BUPIVACAINE HCL 0.25 % IJ SOLN
INTRAMUSCULAR | Status: DC | PRN
  Administered 2019-09-25: 10 mL

## 2019-09-25 MED ORDER — OXYCODONE HCL 5 MG/5ML PO SOLN: 5.0000 mg | Freq: Once | ORAL | Status: DC | PRN

## 2019-09-25 MED ORDER — OXYCODONE HCL 5 MG PO TABS: 5.0000 mg | ORAL_TABLET | Freq: Once | ORAL | Status: DC | PRN

## 2019-09-25 MED ORDER — LIDOCAINE 2% (20 MG/ML) 5 ML SYRINGE
INTRAMUSCULAR | Status: DC | PRN
  Administered 2019-09-25: 100 mg via INTRAVENOUS

## 2019-09-25 MED ORDER — SCOPOLAMINE 1 MG/3DAYS TD PT72: 1.0000 | MEDICATED_PATCH | TRANSDERMAL | Status: DC

## 2019-09-25 MED ORDER — MISOPROSTOL 200 MCG PO TABS
ORAL_TABLET | ORAL | Status: AC
  Filled 2019-09-25: qty 1

## 2019-09-25 MED ORDER — ONDANSETRON HCL 4 MG/2ML IJ SOLN
INTRAMUSCULAR | Status: DC | PRN
  Administered 2019-09-25: 4 mg via INTRAVENOUS

## 2019-09-25 MED ORDER — KETOROLAC TROMETHAMINE 30 MG/ML IJ SOLN
INTRAMUSCULAR | Status: DC | PRN
  Administered 2019-09-25: 30 mg via INTRAVENOUS

## 2019-09-25 MED ORDER — ROCURONIUM 10MG/ML (10ML) SYRINGE FOR MEDFUSION PUMP - OPTIME
INTRAVENOUS | Status: DC | PRN
  Administered 2019-09-25: 10 mg via INTRAVENOUS

## 2019-09-25 MED ORDER — BUPIVACAINE HCL (PF) 0.25 % IJ SOLN
INTRAMUSCULAR | Status: AC
  Filled 2019-09-25: qty 30

## 2019-09-25 MED ORDER — MIDAZOLAM HCL 2 MG/2ML IJ SOLN
INTRAMUSCULAR | Status: AC
  Filled 2019-09-25: qty 2

## 2019-09-25 MED ORDER — PHENYLEPHRINE HCL (PRESSORS) 10 MG/ML IV SOLN
INTRAVENOUS | Status: DC | PRN
  Administered 2019-09-25: 80 ug via INTRAVENOUS

## 2019-09-25 MED ORDER — SUGAMMADEX SODIUM 200 MG/2ML IV SOLN
INTRAVENOUS | Status: DC | PRN
  Administered 2019-09-25: 100 mg via INTRAVENOUS

## 2019-09-25 SURGICAL SUPPLY — 15 items
CANISTER SUCT 3000ML PPV (MISCELLANEOUS) ×3 IMPLANT
CATH ROBINSON RED A/P 16FR (CATHETERS) ×3 IMPLANT
CONT SPEC 4OZ CLIKSEAL STRL BL (MISCELLANEOUS) ×3 IMPLANT
GLOVE BIO SURGEON STRL SZ7.5 (GLOVE) ×3 IMPLANT
GLOVE BIOGEL PI IND STRL 7.0 (GLOVE) ×1 IMPLANT
GLOVE BIOGEL PI INDICATOR 7.0 (GLOVE) ×2
GOWN STRL REUS W/ TWL LRG LVL3 (GOWN DISPOSABLE) ×1 IMPLANT
GOWN STRL REUS W/ TWL XL LVL3 (GOWN DISPOSABLE) ×1 IMPLANT
GOWN STRL REUS W/TWL LRG LVL3 (GOWN DISPOSABLE) ×2
GOWN STRL REUS W/TWL XL LVL3 (GOWN DISPOSABLE) ×2
KIT TURNOVER KIT B (KITS) ×3 IMPLANT
PACK VAGINAL MINOR WOMEN LF (CUSTOM PROCEDURE TRAY) ×3 IMPLANT
PAD OB MATERNITY 4.3X12.25 (PERSONAL CARE ITEMS) ×3 IMPLANT
TOWEL GREEN STERILE FF (TOWEL DISPOSABLE) ×6 IMPLANT
UNDERPAD 30X30 (UNDERPADS AND DIAPERS) ×3 IMPLANT

## 2019-09-25 NOTE — Anesthesia Preprocedure Evaluation (Signed)
Anesthesia Evaluation  Patient identified by MRN, date of birth, ID band Patient awake    Reviewed: Allergy & Precautions, NPO status , Patient's Chart, lab work & pertinent test results  Airway Mallampati: II  TM Distance: >3 FB Neck ROM: Full    Dental no notable dental hx. (+) Teeth Intact   Pulmonary neg pulmonary ROS,    Pulmonary exam normal breath sounds clear to auscultation       Cardiovascular negative cardio ROS Normal cardiovascular exam Rhythm:Regular Rate:Normal     Neuro/Psych negative neurological ROS  negative psych ROS   GI/Hepatic GERD  Medicated and Controlled,(+)     substance abuse  marijuana use, Hyperemesis gravidum   Endo/Other  Hyperthyroidism   Renal/GU negative Renal ROS  negative genitourinary   Musculoskeletal negative musculoskeletal ROS (+)   Abdominal   Peds  Hematology  (+) anemia ,   Anesthesia Other Findings   Reproductive/Obstetrics (+) Pregnancy Missed Ab Possible molar pregnancy                             Anesthesia Physical Anesthesia Plan  ASA: II  Anesthesia Plan: General   Post-op Pain Management:    Induction: Intravenous, Rapid sequence and Cricoid pressure planned  PONV Risk Score and Plan: 4 or greater and Ondansetron, Scopolamine patch - Pre-op, Dexamethasone, Midazolam and Treatment may vary due to age or medical condition  Airway Management Planned: Oral ETT  Additional Equipment:   Intra-op Plan:   Post-operative Plan: Extubation in OR  Informed Consent: I have reviewed the patients History and Physical, chart, labs and discussed the procedure including the risks, benefits and alternatives for the proposed anesthesia with the patient or authorized representative who has indicated his/her understanding and acceptance.     Dental advisory given  Plan Discussed with: CRNA and Surgeon  Anesthesia Plan Comments:          Anesthesia Quick Evaluation

## 2019-09-25 NOTE — Progress Notes (Signed)
Per patient, fiance, Jacki Cones can be contacted after the procedure at [phone]

## 2019-09-25 NOTE — Progress Notes (Signed)
Report called, pt transported to main OR

## 2019-09-25 NOTE — Anesthesia Procedure Notes (Signed)
Procedure Name: Intubation Date/Time: 09/25/2019 9:26 AM Performed by: Clearnce Sorrel, CRNA Pre-anesthesia Checklist: Patient identified, Emergency Drugs available, Suction available, Patient being monitored and Timeout performed Patient Re-evaluated:Patient Re-evaluated prior to induction Oxygen Delivery Method: Circle system utilized Preoxygenation: Pre-oxygenation with 100% oxygen Induction Type: IV induction, Rapid sequence and Cricoid Pressure applied Laryngoscope Size: Mac and 3 Grade View: Grade I Tube type: Oral Tube size: 7.0 mm Number of attempts: 1 Airway Equipment and Method: Stylet Placement Confirmation: ETT inserted through vocal cords under direct vision,  positive ETCO2 and breath sounds checked- equal and bilateral Secured at: 22 cm Tube secured with: Tape Dental Injury: Teeth and Oropharynx as per pre-operative assessment

## 2019-09-25 NOTE — Op Note (Signed)
Evelyn [name] PROCEDURE DATE: 09/25/2019  PREOPERATIVE DIAGNOSIS: 9 week missed abortion POSTOPERATIVE DIAGNOSIS: The same PROCEDURE:     Dilation and Evacuation SURGEON:  Dr. Legrand Como L. Shawna Kiener  INDICATIONS: 28 y.o. G1P0000 with MAB at [redacted] weeks gestation, needing surgical completion.  Risks of surgery were discussed with the patient including but not limited to: bleeding which may require transfusion; infection which may require antibiotics; injury to uterus or surrounding organs; need for additional procedures including laparotomy or laparoscopy; possibility of intrauterine scarring which may impair future fertility; and other postoperative/anesthesia complications. Written informed consent was obtained.    FINDINGS:  A 9 week size uterus, moderate amounts of products of conception, specimen sent to pathology.  ANESTHESIA:    GETA, paracervical block. INTRAVENOUS FLUIDS:  As recorded ESTIMATED BLOOD LOSS:  50 ml. SPECIMENS:  Products of conception and endometrial curettings sent to pathology with specific notation for rule out molar pregnancy COMPLICATIONS:  None immediate.  PROCEDURE DETAILS:  The patient received intravenous Doxycycline while in the preoperative area.  She was then taken to the operating room where monitored intravenous sedation was administered and was found to be adequate.  After an adequate timeout was performed, she was placed in the dorsal lithotomy position and examined; then prepped and draped in the sterile manner.   Her bladder was catheterized for an unmeasured amount of clear, yellow urine. A vaginal speculum was then placed in the patient's vagina and a single tooth tenaculum was applied to the anterior lip of the cervix.  A paracervical block using 10 ml of 0.25% Marcaine was administered. The cervix was gently dilated to accommodate a 8 mm suction curette that was gently advanced to the uterine fundus.  The suction device was then activated and curette slowly  rotated to clear the uterus of products of conception.  A sharp curettage was then performed to confirm complete emptying of the uterus. Sharp uterine curettage was then performed until a uterine cri was noted. These curettings were sent to pathology separately.  There was minimal bleeding noted and the tenaculum removed with good hemostasis noted.   All instruments were removed from the patient's vagina. Pt received TXA prior to procedure and 200 mcg of Cytotec was placed rectal at conclusion of the case  Sponge and instrument counts were correct times two  The patient tolerated the procedure well and was taken to the recovery area awake, and in stable condition.  The patient will be discharged to home as per PACU criteria.  Routine postoperative instructions given.  She was prescribed Percocet and Ibuprofen .  She will follow up in the clinic on 1 week for postoperative evaluation.   Brodyn Depuy L. Rip Harbour, MD, [place] Attending [place], [facility]

## 2019-09-25 NOTE — Anesthesia Postprocedure Evaluation (Signed)
Anesthesia Post Note  Patient: Engineer, structural) Performed: SUCTION DILATATION AND CURETTAGE (N/A Vagina )     Patient location during evaluation: PACU Anesthesia Type: General Level of consciousness: awake and alert Pain management: pain level controlled Vital Signs Assessment: post-procedure vital signs reviewed and stable Respiratory status: spontaneous breathing, nonlabored ventilation and respiratory function stable Cardiovascular status: stable Postop Assessment: no apparent nausea or vomiting Anesthetic complications: no Comments: Patient given Labetalol in PACU for HTN    Last Vitals:  Vitals:   09/25/19 1048 09/25/19 1055  BP: (!) 166/96   Pulse: 71 72  Resp: 12 12  Temp:    SpO2: 100% 100%    Last Pain:  Vitals:   09/25/19 1115  TempSrc:   PainSc: 0-No pain                 Taevin Mcferran A.

## 2019-09-25 NOTE — Progress Notes (Signed)
HD # 2 Missed AB  Pt nervous this morning about procedure. No N/V. No vaginal bleeding.  PE AF VSS Lungs clear Heart RRR Abd soft + BS  A/P Missed AB For D & C today. NPO since midnight. R/B/post op care reviewed with pt. Pt verbalized understanding. To OR when ready.

## 2019-09-25 NOTE — Transfer of Care (Signed)
Immediate Anesthesia Transfer of Care Note  Patient: Medical laboratory scientific officer  Procedure(s) Performed: SUCTION DILATATION AND CURETTAGE (N/A Vagina )  Patient Location: PACU  Anesthesia Type:General  Level of Consciousness: awake, alert  and oriented  Airway & Oxygen Therapy: Patient Spontanous Breathing and Patient connected to nasal cannula oxygen  Post-op Assessment: Report given to RN and Post -op Vital signs reviewed and stable  Post vital signs: Reviewed and stable  Last Vitals:  Vitals Value Taken Time  BP 128/95 09/25/19 1015  Temp    Pulse 68 09/25/19 1024  Resp 10 09/25/19 1024  SpO2 100 % 09/25/19 1024  Vitals shown include unvalidated device data.  Last Pain:  Vitals:   09/25/19 1015  TempSrc:   PainSc: (P) Asleep         Complications: No apparent anesthesia complications

## 2019-09-25 NOTE — Discharge Summary (Signed)
Physician Discharge Summary  Patient ID: Evelyn [name] MRN: [id number] DOB/AGE: 07-May-1991 28 y.o.  Admit date: 09/23/2019 Discharge date: 09/25/2019  Admission Diagnoses: Missed AB, hyperemesis, dehydration, hypokalemia  Discharge Diagnoses:  Active Problems:   Hypokalemia due to excessive gastrointestinal loss of potassium   Dehydration with hyponatremia   Hyperemesis gravidarum before end of [redacted] week gestation, dehydration   Discharged Condition: good  Hospital Course: Pt was admitted with above DX. Start IV fluids and potassium replaced. Dehydration and hypokalemia were corrected. N/V resolved. Pt was taken to OR on 09/25/19 for D & C. See OP note for additional information. Pt was discharged home from PACU with follow up next week  Consults: NA  Significant Diagnostic Studies: labs  Treatments: IV hydration and D & C  Discharge Exam: Blood pressure (!) 166/96, pulse 72, temperature (!) 97.2 F (36.2 C), resp. rate 12, height 5\' 1"  (1.549 m), weight 40.6 kg, last menstrual period 07/05/2019, SpO2 100 %.  Lungs clear Heart RRR Abd soft + BS  GU deferred Ext non tender   Disposition: Discharge disposition: 01-Home or Self Care       Discharge Instructions    Call MD for:  difficulty breathing, headache or visual disturbances   Complete by: As directed    Call MD for:  extreme fatigue   Complete by: As directed    Call MD for:  hives   Complete by: As directed    Call MD for:  persistant dizziness or light-headedness   Complete by: As directed    Call MD for:  persistant nausea and vomiting   Complete by: As directed    Call MD for:  redness, tenderness, or signs of infection (pain, swelling, redness, odor or green/yellow discharge around incision site)   Complete by: As directed    Call MD for:  severe uncontrolled pain   Complete by: As directed    Call MD for:  temperature >100.4   Complete by: As directed    Diet - low sodium heart healthy   Complete  by: As directed    Discharge instructions   Complete by: As directed    Call for heavy vaginal bleeding, soaking more than 1 pad an hour.   Increase activity slowly   Complete by: As directed    Sexual Activity Restrictions   Complete by: As directed    Pelvic rest x 4 weeks     Allergies as of 09/25/2019      Reactions   Beef-derived Products    Prefer not to eat   Penicillins Other (See Comments)   Pt reports "tongue swelling."   Pork-derived Products    Prefer not to eat      Medication List    STOP taking these medications   acetaminophen 500 MG tablet Commonly known as: TYLENOL   doxylamine (Sleep) 25 MG tablet Commonly known as: UNISOM   misoprostol 200 MCG tablet Commonly known as: CYTOTEC   potassium chloride 10 MEQ tablet Commonly known as: KLOR-CON   prenatal multivitamin Tabs tablet   prochlorperazine 10 MG tablet Commonly known as: COMPAZINE   pyridOXINE 25 MG tablet Commonly known as: VITAMIN B-6     TAKE these medications   ibuprofen 800 MG tablet Commonly known as: ADVIL Take 1 tablet (800 mg total) by mouth every 8 (eight) hours as needed. What changed:   medication strength  how much to take  when to take this   oxyCODONE 5 MG immediate release tablet Commonly known  as: Oxy IR/ROXICODONE Take 1 tablet (5 mg total) by mouth every 6 (six) hours as needed for severe pain.   promethazine 25 MG tablet Commonly known as: PHENERGAN Take 1 tablet (25 mg total) by mouth every 6 (six) hours as needed for nausea or vomiting. What changed: Another medication with the same name was removed. Continue taking this medication, and follow the directions you see here.      Follow-up Information    [facility]   Specialty: Obstetrics and Gynecology Why: Pt laready has appt next week Contact information: [address], [address] [id number] m[place] [address] [phone]           Signed: Hermina Staggers 09/25/2019, 2:19 PM

## 2019-09-25 NOTE — Discharge Instructions (Signed)
Dilation and Curettage or Vacuum Curettage, Care After °This sheet gives you information about how to care for yourself after your procedure. Your health care provider may also give you more specific instructions. If you have problems or questions, contact your health care provider. °What can I expect after the procedure? °After your procedure, it is common to have: °· Mild pain or cramping. °· Some vaginal bleeding or spotting. °These may last for up to 2 weeks after your procedure. °Follow these instructions at home: °Activity ° °· Do not drive or use heavy machinery while taking prescription pain medicine. °· Avoid driving for the first 24 hours after your procedure. °· Take frequent, short walks, followed by rest periods, throughout the day. Ask your health care provider what activities are safe for you. After 1-2 days, you may be able to return to your normal activities. °· Do not lift anything heavier than 10 lb (4.5 kg) until your health care provider approves. °· For at least 2 weeks, or as long as told by your health care provider, do not: °? Douche. °? Use tampons. °? Have sexual intercourse. °General instructions ° °· Take over-the-counter and prescription medicines only as told by your health care provider. This is especially important if you take blood thinning medicine. °· Do not take baths, swim, or use a hot tub until your health care provider approves. Take showers instead of baths. °· Wear compression stockings as told by your health care provider. These stockings help to prevent blood clots and reduce swelling in your legs. °· It is your responsibility to get the results of your procedure. Ask your health care provider, or the department performing the procedure, when your results will be ready. °· Keep all follow-up visits as told by your health care provider. This is important. °Contact a health care provider if: °· You have severe cramps that get worse or that do not get better with  medicine. °· You have severe abdominal pain. °· You cannot drink fluids without vomiting. °· You develop pain in a different area of your pelvis. °· You have bad-smelling vaginal discharge. °· You have a rash. °Get help right away if: °· You have vaginal bleeding that soaks more than one sanitary pad in 1 hour, for 2 hours in a row. °· You pass large blood clots from your vagina. °· You have a fever that is above 100.4°F (38.0°C). °· Your abdomen feels very tender or hard. °· You have chest pain. °· You have shortness of breath. °· You cough up blood. °· You feel dizzy or light-headed. °· You faint. °· You have pain in your neck or shoulder area. °This information is not intended to replace advice given to you by your health care provider. Make sure you discuss any questions you have with your health care provider. °Document Released: 11/23/2000 Document Revised: 11/08/2017 Document Reviewed: 06/28/2016 °Elsevier Patient Education © 2020 Elsevier Inc. ° °

## 2019-09-26 ENCOUNTER — Encounter (HOSPITAL_COMMUNITY): Payer: Self-pay | Admitting: Obstetrics and Gynecology

## 2019-09-28 LAB — SURGICAL PATHOLOGY

## 2019-09-29 ENCOUNTER — Ambulatory Visit: Payer: Medicaid Other | Admitting: Student

## 2019-09-29 LAB — GC/CHLAMYDIA PROBE AMP (~~LOC~~) NOT AT ARMC
Chlamydia: NEGATIVE
Comment: NEGATIVE
Comment: NORMAL
Neisseria Gonorrhea: NEGATIVE

## 2019-10-01 ENCOUNTER — Other Ambulatory Visit: Payer: Self-pay

## 2019-10-01 ENCOUNTER — Ambulatory Visit (INDEPENDENT_AMBULATORY_CARE_PROVIDER_SITE_OTHER): Payer: Medicaid Other | Admitting: Obstetrics & Gynecology

## 2019-10-01 VITALS — Ht 61.0 in | Wt 94.6 lb

## 2019-10-01 DIAGNOSIS — O039 Complete or unspecified spontaneous abortion without complication: Secondary | ICD-10-CM

## 2019-10-01 NOTE — Patient Instructions (Signed)
Miscarriage °A miscarriage is the loss of an unborn baby (fetus) before the 20th week of pregnancy. °Follow these instructions at home: °Medicines ° °· Take over-the-counter and prescription medicines only as told by your doctor. °· If you were prescribed antibiotic medicine, take it as told by your doctor. Do not stop taking the antibiotic even if you start to feel better. °· Do not take NSAIDs unless your doctor says that this is safe for you. NSAIDs include aspirin and ibuprofen. These medicines can cause bleeding. °Activity °· Rest as directed. Ask your doctor what activities are safe for you. °· Have someone help you at home during this time. °General instructions °· Write down how many pads you use each day and how soaked they are. °· Watch the amount of tissue or clumps of blood (blood clots) that you pass from your vagina. Save any large amounts of tissue for your doctor. °· Do not use tampons, douche, or have sex until your doctor approves. °· To help you and your partner with the process of grieving, talk with your doctor or seek counseling. °· When you are ready, meet with your doctor to talk about steps you should take for your health. Also, talk with your doctor about steps to take to have a healthy pregnancy in the future. °· Keep all follow-up visits as told by your doctor. This is important. °Contact a doctor if: °· You have a fever or chills. °· You have vaginal discharge that smells bad. °· You have more bleeding. °Get help right away if: °· You have very bad cramps or pain in your back or belly. °· You pass clumps of blood that are walnut-sized or larger from your vagina. °· You pass tissue that is walnut-sized or larger from your vagina. °· You soak more than 1 regular pad in an hour. °· You get light-headed or weak. °· You faint (pass out). °· You have feelings of sadness that do not go away, or you have thoughts of hurting yourself. °Summary °· A miscarriage is the loss of an unborn baby before  the 20th week of pregnancy. °· Follow your doctor's instructions for home care. Keep all follow-up appointments. °· To help you and your partner with the process of grieving, talk with your doctor or seek counseling. °This information is not intended to replace advice given to you by your health care provider. Make sure you discuss any questions you have with your health care provider. °Document Released: 02/18/2012 Document Revised: 03/20/2019 Document Reviewed: 01/01/2017 °Elsevier Patient Education © 2020 Elsevier Inc. ° °

## 2019-10-01 NOTE — Progress Notes (Signed)
Subjective:     Evelyn [name] is a 28 y.o. female who presents to the clinic 1 weeks status post suction D&C for missed AB. Eating a regular diet without difficulty. Bowel movements are normal. The patient is not having any pain.  The following portions of the patient's history were reviewed and updated as appropriate: allergies, current medications, past family history, past medical history, past social history, past surgical history and problem list.  Review of Systems Pertinent items are noted in HPI.    Objective:    Ht 5\' 1"  (1.549 m)   Wt 94 lb 9.6 oz (42.9 kg)   LMP 07/05/2019 (Approximate)   BMI 17.87 kg/m  General:  alert, cooperative and no distress  Abdomen: Not distended      Assessment:    Doing well postoperatively. Operative findings again reviewed. Pathology report discussed.   Results for Evelyn, [name] (MRN [id number]) as of 10/01/2019 15:10  Ref. Range 09/23/2019 15:32  HCG, Beta Chain, Quant, S Latest Ref Range: <5 mIU/mL 295,585 (H)  Benign POC on path no molar tissue Plan:    1. Continue any current medications. 2. Wound care discussed. 3. Activity restrictions: none 4. Anticipated return to work: 1-2 weeks. 5. Follow up: HCG in 2 weeks due to high HCG although path showed no molar tissue  Patient ID: Evelyn [name], female   DOB: 1991-01-17, 28 y.o.   MRN: [id number]  Woodroe Mode, MD 10/01/2019

## 2019-10-08 ENCOUNTER — Other Ambulatory Visit: Payer: Self-pay | Admitting: Lactation Services

## 2019-10-08 DIAGNOSIS — O039 Complete or unspecified spontaneous abortion without complication: Secondary | ICD-10-CM

## 2019-10-08 NOTE — Progress Notes (Signed)
Future lab orders placed

## 2019-10-15 ENCOUNTER — Other Ambulatory Visit: Payer: Medicaid Other

## 2019-10-15 ENCOUNTER — Other Ambulatory Visit: Payer: Self-pay

## 2019-10-15 DIAGNOSIS — O039 Complete or unspecified spontaneous abortion without complication: Secondary | ICD-10-CM

## 2019-10-16 LAB — BETA HCG QUANT (REF LAB): hCG Quant: 668 m[IU]/mL

## 2019-10-20 ENCOUNTER — Telehealth: Payer: Self-pay

## 2019-10-20 ENCOUNTER — Other Ambulatory Visit: Payer: Self-pay | Admitting: Lactation Services

## 2019-10-20 DIAGNOSIS — O039 Complete or unspecified spontaneous abortion without complication: Secondary | ICD-10-CM

## 2019-10-20 NOTE — Telephone Encounter (Addendum)
-----   Message from Woodroe Mode, MD sent at 10/19/2019  7:16 PM EST ----- Repeat HCG this week, follow to zero  Notified pt of results and the need to f/u pregnancy hormone level until it gets to zero.  Pt reports that she can come in on 10/22/19 at 1000 am.  [place] office notified.    Mel Almond, RN 10/20/19

## 2019-10-22 ENCOUNTER — Other Ambulatory Visit: Payer: Self-pay

## 2019-10-22 ENCOUNTER — Other Ambulatory Visit: Payer: Medicaid Other

## 2019-10-22 DIAGNOSIS — O039 Complete or unspecified spontaneous abortion without complication: Secondary | ICD-10-CM

## 2019-10-23 ENCOUNTER — Telehealth: Payer: Self-pay | Admitting: *Deleted

## 2019-10-23 LAB — BETA HCG QUANT (REF LAB): hCG Quant: 272 m[IU]/mL

## 2019-10-23 NOTE — Telephone Encounter (Signed)
-----   Message from Woodroe Mode, MD sent at 10/23/2019  8:23 AM EST ----- Repeat this in one week

## 2019-10-23 NOTE — Telephone Encounter (Signed)
I called Evelyn [name] and gave her results of bhcg and that she needs to schedule another weekly blood draw because it has not returned to normal. She agreed to 10/29/19 1:30pm. Linda,RN

## 2019-10-27 ENCOUNTER — Other Ambulatory Visit: Payer: Self-pay | Admitting: *Deleted

## 2019-10-27 DIAGNOSIS — O039 Complete or unspecified spontaneous abortion without complication: Secondary | ICD-10-CM

## 2019-10-29 ENCOUNTER — Other Ambulatory Visit: Payer: Medicaid Other

## 2019-10-29 ENCOUNTER — Other Ambulatory Visit: Payer: Self-pay

## 2019-10-29 DIAGNOSIS — O039 Complete or unspecified spontaneous abortion without complication: Secondary | ICD-10-CM

## 2019-10-30 LAB — BETA HCG QUANT (REF LAB): hCG Quant: 123 m[IU]/mL

## 2019-11-12 ENCOUNTER — Other Ambulatory Visit: Payer: Medicaid Other

## 2019-11-12 ENCOUNTER — Other Ambulatory Visit: Payer: Self-pay

## 2019-11-12 DIAGNOSIS — O039 Complete or unspecified spontaneous abortion without complication: Secondary | ICD-10-CM

## 2019-11-13 ENCOUNTER — Telehealth: Payer: Self-pay

## 2019-11-13 DIAGNOSIS — O039 Complete or unspecified spontaneous abortion without complication: Secondary | ICD-10-CM

## 2019-11-13 LAB — BETA HCG QUANT (REF LAB): hCG Quant: 49 m[IU]/mL

## 2019-11-13 NOTE — Telephone Encounter (Addendum)
-----   Message from Chancy Milroy, MD sent at 11/13/2019  8:26 AM EST ----- Repeat BHCG in 2 weeks Thanks  Notified pt beta results are 49 and we want to to make sure it gets to zero. I informed pt that provider would like for her to come in two weeks for rpt beta.  Pt stated that she would be able to come in on 11/26/19 @ 0900.  [place] office notified.

## 2019-11-26 ENCOUNTER — Other Ambulatory Visit: Payer: Medicaid Other

## 2019-11-26 ENCOUNTER — Other Ambulatory Visit: Payer: Self-pay

## 2019-11-26 DIAGNOSIS — O039 Complete or unspecified spontaneous abortion without complication: Secondary | ICD-10-CM

## 2019-11-27 LAB — BETA HCG QUANT (REF LAB): hCG Quant: 20 m[IU]/mL

## 2019-12-10 ENCOUNTER — Other Ambulatory Visit: Payer: Self-pay | Admitting: *Deleted

## 2019-12-10 DIAGNOSIS — O039 Complete or unspecified spontaneous abortion without complication: Secondary | ICD-10-CM

## 2019-12-14 ENCOUNTER — Other Ambulatory Visit: Payer: Self-pay

## 2019-12-14 ENCOUNTER — Other Ambulatory Visit: Payer: Medicaid Other

## 2019-12-14 DIAGNOSIS — O039 Complete or unspecified spontaneous abortion without complication: Secondary | ICD-10-CM

## 2019-12-15 ENCOUNTER — Telehealth: Payer: Self-pay

## 2019-12-15 DIAGNOSIS — O039 Complete or unspecified spontaneous abortion without complication: Secondary | ICD-10-CM

## 2019-12-15 LAB — BETA HCG QUANT (REF LAB): hCG Quant: 10 m[IU]/mL

## 2019-12-15 NOTE — Telephone Encounter (Addendum)
-----   Message from Hermina Staggers, MD sent at 12/15/2019  7:13 AM EST ----- BHCG in 2 weeks, no stat  LM that I am calling in regards to results and f/u appt if she could please give the office a call back.

## 2019-12-16 ENCOUNTER — Encounter: Payer: Self-pay | Admitting: *Deleted

## 2019-12-16 NOTE — Telephone Encounter (Signed)
Called pt and the call went straight to her VM. I left a message stating that I am calling with test results and will send a detailed message to her MyChart account as I see that she checks those messages. If she has questions, she may send a reply message back to [place].

## 2019-12-28 ENCOUNTER — Other Ambulatory Visit: Payer: Medicaid Other

## 2019-12-28 ENCOUNTER — Other Ambulatory Visit: Payer: Self-pay

## 2019-12-28 DIAGNOSIS — O039 Complete or unspecified spontaneous abortion without complication: Secondary | ICD-10-CM

## 2019-12-29 LAB — BETA HCG QUANT (REF LAB): hCG Quant: 7 m[IU]/mL

## 2020-01-14 ENCOUNTER — Telehealth: Payer: Self-pay | Admitting: Obstetrics & Gynecology

## 2020-01-14 NOTE — Telephone Encounter (Signed)
Lack of Transportation °

## 2020-01-15 ENCOUNTER — Ambulatory Visit: Payer: Medicaid Other | Admitting: Obstetrics & Gynecology

## 2020-02-03 ENCOUNTER — Ambulatory Visit (INDEPENDENT_AMBULATORY_CARE_PROVIDER_SITE_OTHER): Payer: Medicaid Other | Admitting: Obstetrics & Gynecology

## 2020-02-03 ENCOUNTER — Encounter: Payer: Self-pay | Admitting: Obstetrics & Gynecology

## 2020-02-03 ENCOUNTER — Other Ambulatory Visit: Payer: Self-pay

## 2020-02-03 VITALS — BP 127/74 | HR 62 | Ht 62.0 in | Wt 106.0 lb

## 2020-02-03 DIAGNOSIS — O2690 Pregnancy related conditions, unspecified, unspecified trimester: Secondary | ICD-10-CM | POA: Insufficient documentation

## 2020-02-03 DIAGNOSIS — O021 Missed abortion: Secondary | ICD-10-CM | POA: Diagnosis present

## 2020-02-03 DIAGNOSIS — Z3490 Encounter for supervision of normal pregnancy, unspecified, unspecified trimester: Secondary | ICD-10-CM

## 2020-02-03 NOTE — Patient Instructions (Signed)
Molar Pregnancy  A molar pregnancy (hydatidiform mole) is a mass of tissue that grows in the uterus after an egg is fertilized incorrectly. The mass does not develop into a fetus, and is considered an abnormal pregnancy. Usually, the pregnancy ends on its own through miscarriage. In some cases, treatment may be required. What are the causes? This condition is caused by an egg that is fertilized incorrectly so that it has abnormal genetic material (chromosomes). This can result in one of two types of molar pregnancies:  Complete molar pregnancy. This is when all of the chromosomes in the fertilized egg are from the father, and none are from the mother.  Partial molar pregnancy. This is when the fertilized egg has chromosomes from the father and mother, but it has too many chromosomes. What increases the risk? This condition is more likely to develop in:  Women who are over the age of 57 or under the age of 42.  Women who have had a molar pregnancy in the past (very rare). Other possible risk factors include:  Smoking more than 15 cigarettes a day.  History of infertility.  Having a blood type A, B, or AB.  Having a lack (deficiency) of vitamin A.  Using birth control pills (oral contraceptives). What are the signs or symptoms? Symptoms of this condition include:  Vaginal bleeding.  Missed menstrual period.  The uterus growing faster than expected for a normal pregnancy.  Severe nausea and vomiting.  Severe pressure or pain in the uterus.  Abnormal ovarian cysts (theca lutein cysts).  Vaginal discharge that looks like grapes.  High blood pressure (early onset of preeclampsia).  Overactive thyroid gland (hyperthyroidism).  Not having enough red blood cells or hemoglobin (anemia). How is this diagnosed? This condition is diagnosed based on ultrasound and blood tests. How is this treated? Usually, molar pregnancies end on their own by miscarriage. A health care provider  may manage this condition by:  Monitoring the levels of pregnancy hormones in your blood to make sure that the hormone levels are decreasing as expected.  Giving you a medicine called Rho (D) immune globulin. This medicine helps to prevent problems that may occur in future pregnancies as a result of a protein on red blood cells (Rh factor). You may be given this medicine if you do not have an Rh factor (you are Rh negative) and your sex partner has an Rh factor (he is Rh positive).  Putting you on chemotherapy. This involves taking medicines that regulate levels of pregnancy hormones. This may be done if your pregnancy hormone levels are not decreasing as expected.  Performing a procedure called dilation and curettage (D&C), or vacuum curettage. These are minor procedures that involve scraping or suctioning the molar pregnancy out of the uterus and removing it through the vagina. Even if a molar pregnancy ends on its own, one of these procedures may be done to make sure that all the abnormal tissue is out of the uterus.  Doing a surgical removal of the uterus (hysterectomy). Follow these instructions at home:  Avoid getting pregnant for 6-12 months, or as long as told by your health care provider. To avoid getting pregnant, avoid having sex or use a reliable form of birth control every time you have sex.  Take over-the-counter and prescription medicines only as told by your health care provider.  Rest as needed, and slowly return to your normal activities.  Think about joining a support group. If you are struggling with grief, ask  your health care provider for help.  Keep all follow-up visits as told by your health care provider. This is important. You may need follow-up blood tests or ultrasounds. Contact a health care provider if:  You continue to have irregular vaginal bleeding.  You have abdominal pain. Summary  A molar pregnancy (hydatidiform mole) is a mass of tissue that grows in  the uterus after an egg is fertilized incorrectly.  This condition is more likely to develop in women who are over the age of 76 or under the age of 45 or women who have had a molar pregnancy in the past.  The most common symptom of this condition is vaginal bleeding.  Usually, molar pregnancy ends with a miscarriage, and no treatment is needed. This information is not intended to replace advice given to you by your health care provider. Make sure you discuss any questions you have with your health care provider. Document Revised: 11/08/2017 Document Reviewed: 01/30/2017 Elsevier Patient Education  2020 [employer].

## 2020-02-03 NOTE — Addendum Note (Signed)
Addended by: Eliot Ford L on: 02/03/2020 11:15 AM   Modules accepted: Orders

## 2020-02-03 NOTE — Progress Notes (Signed)
Patient ID: Evelyn [name], female   DOB: 08-12-1991, 29 y.o.   MRN: [id number]  Chief Complaint  Patient presents with  . Follow-up    HPI Evelyn [name] is a 29 y.o. female.  She had an abnormal uterine pregnancy 09/2019 and D&E specimen did not show molar tissue. Her HCG was high and we have followed HCG since. The last value 1/18 was 7. Menses are regular and using rhythm/withdrawal for [facility].  LMP 2-3 weeks ago HPI  Past Medical History:  Diagnosis Date  . GERD (gastroesophageal reflux disease)   . Hyperthyroidism     Past Surgical History:  Procedure Laterality Date  . DILATION AND CURETTAGE OF UTERUS N/A 09/25/2019   Procedure: SUCTION DILATATION AND CURETTAGE;  Surgeon: Hermina Staggers, MD;  Location: [facility] OR;  Service: Gynecology;  Laterality: N/A;  . THYROIDECTOMY    . TONSILLECTOMY    . UPPER GI ENDOSCOPY      No family history on file.  Social History Social History   Tobacco Use  . Smoking status: Never Smoker  . Smokeless tobacco: Never Used  Substance Use Topics  . Alcohol use: Not Currently  . Drug use: Yes    Types: Marijuana    Comment: quit when found out pregnant    Allergies  Allergen Reactions  . Beef-Derived Products     Prefer not to eat  . Penicillins Other (See Comments)    Pt reports "tongue swelling."  . 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 ibuprofen (ADVIL) 800 MG tablet Take 1 tablet (800 mg total) by mouth every 8 (eight) hours as needed. 30 tablet 0  . Multiple Vitamin (MULTIVITAMIN WITH MINERALS) TABS tablet Take 1 tablet by mouth daily.    Marland Kitchen oxyCODONE (OXY IR/ROXICODONE) 5 MG immediate release tablet Take 1 tablet (5 mg total) by mouth every 6 (six) hours as needed for severe pain. (Patient not taking: Reported on 10/01/2019) 15 tablet 0  . promethazine (PHENERGAN) 25 MG tablet Take 1 tablet (25 mg total) by mouth every 6 (six) hours as needed for nausea or  vomiting. (Patient not taking: Reported on 09/24/2019) 30 tablet 1   No current facility-administered medications for this visit.    Review of Systems Review of Systems  Constitutional: Negative.   Respiratory: Negative.   Cardiovascular: Negative.   Gastrointestinal: Negative.   Genitourinary: Negative.     Blood pressure 127/74, pulse 62, height 5\' 2"  (1.575 m), weight 106 lb (48.1 kg), last menstrual period 07/05/2019, unknown if currently breastfeeding.  Physical Exam Physical Exam Constitutional:      Appearance: Normal appearance.  Pulmonary:     Effort: Pulmonary effort is normal.  Neurological:     Mental Status: She is alert.  Psychiatric:        Mood and Affect: Mood normal.        Behavior: Behavior normal.     Data Reviewed HCG 7 12/28/19  Assessment S/p abnormal pregnancy following HCG to negative  Plan HCG today and repeat if not negative Avoid pregnancy    12/30/19 02/03/2020, 11:00 AM

## 2020-02-04 LAB — BETA HCG QUANT (REF LAB): hCG Quant: <1 m[IU]/mL

## 2020-12-08 IMAGING — US US OB < 14 WEEKS - US OB TV
1 series · 15 of 28 positions shown · non-contrast
Comparison: None.

CLINICAL DATA: Vaginal bleeding

EXAM:
OBSTETRIC <14 WK US AND TRANSVAGINAL OB US
TECHNIQUE: Both transabdominal and transvaginal ultrasound examinations were
performed for complete evaluation of the gestation as well as the
maternal uterus, adnexal regions, and pelvic cul-de-sac.
Transvaginal technique was performed to assess early pregnancy.

[Series 1: us ob < 14 weeks - us ob tv · 15 of 85 slices shown]
[im 1/85]
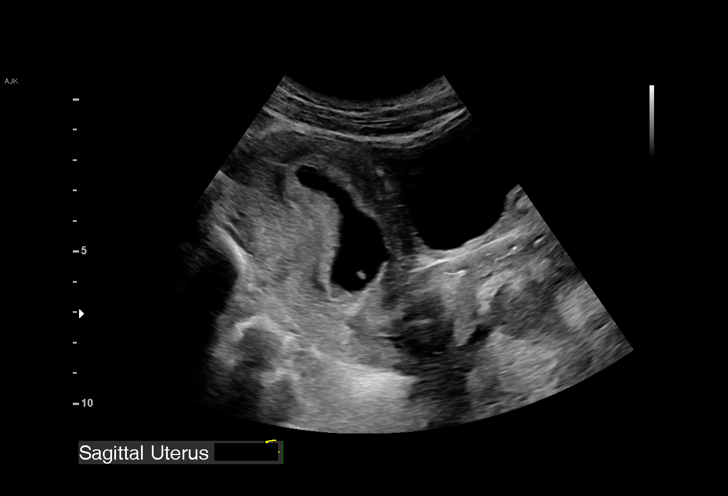
[im 7/85]
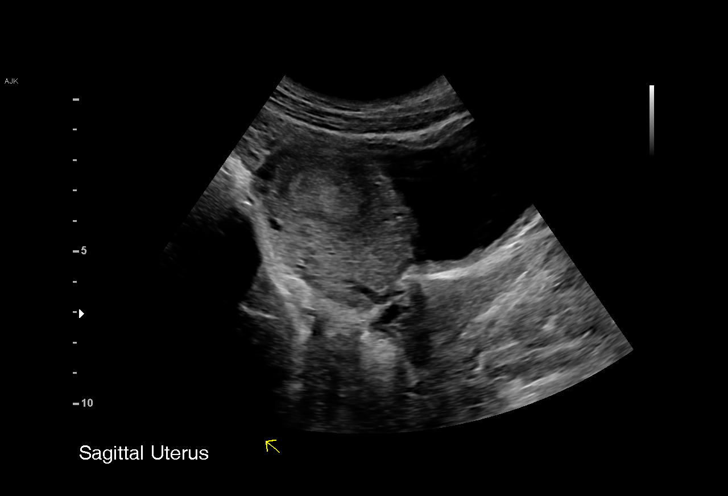
[im 13/85]
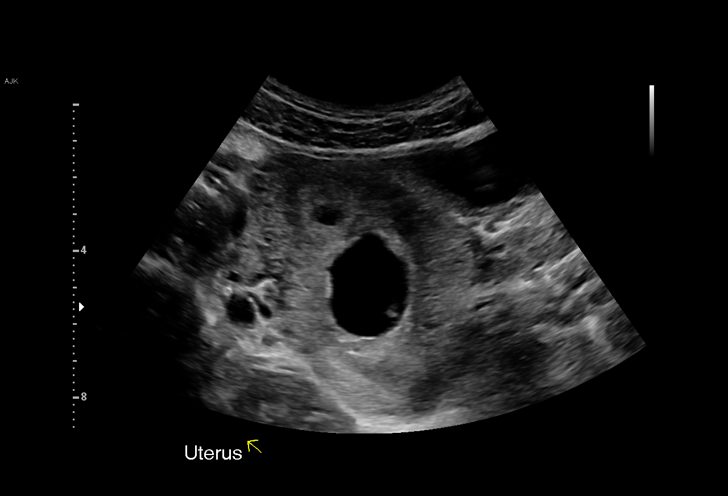
[im 19/85]
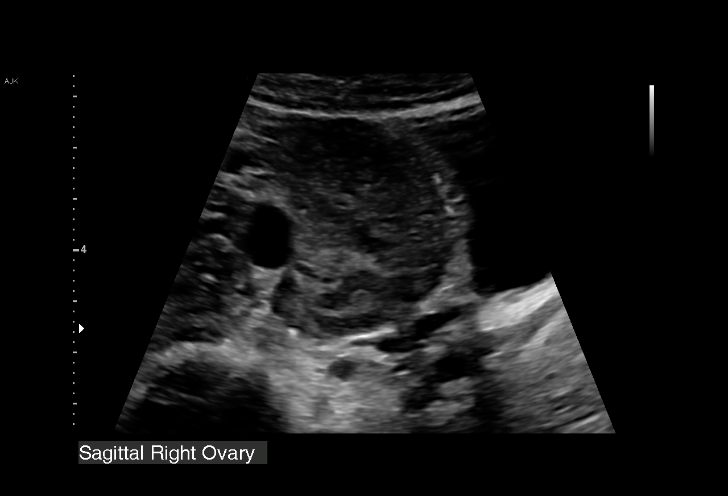
[im 25/85]
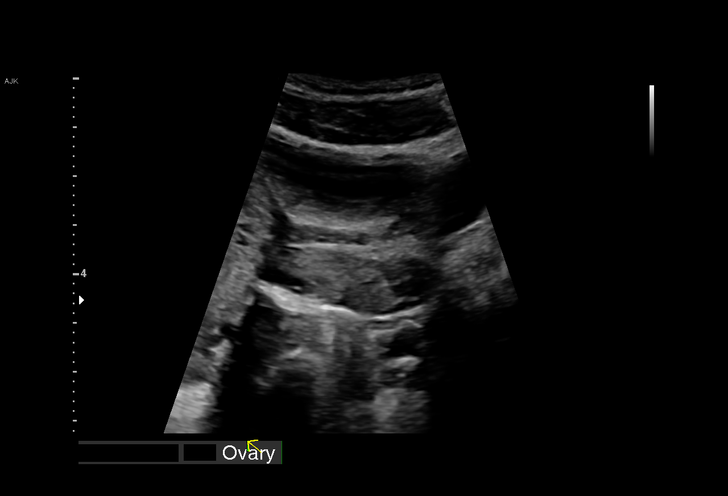
[im 32/85]
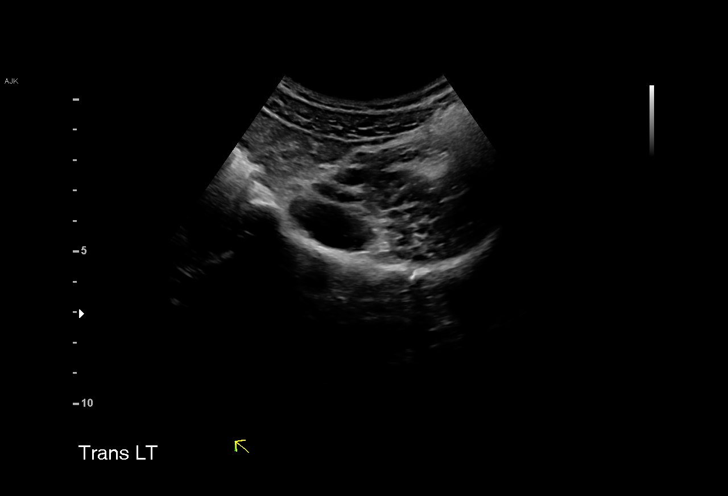
[im 38/85]
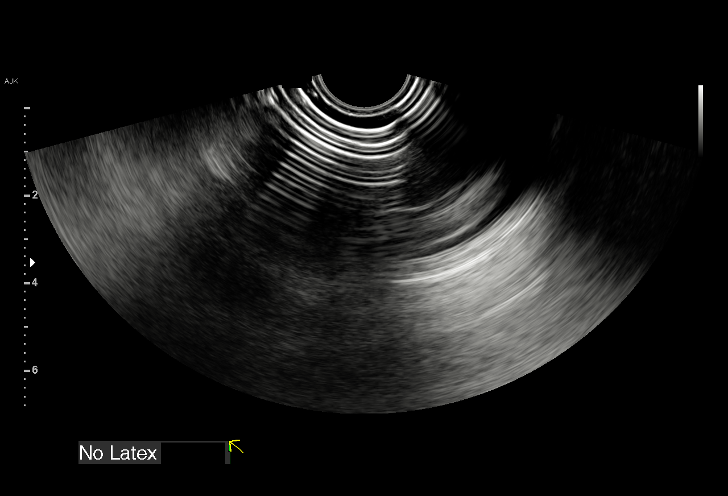
[im 44/85]
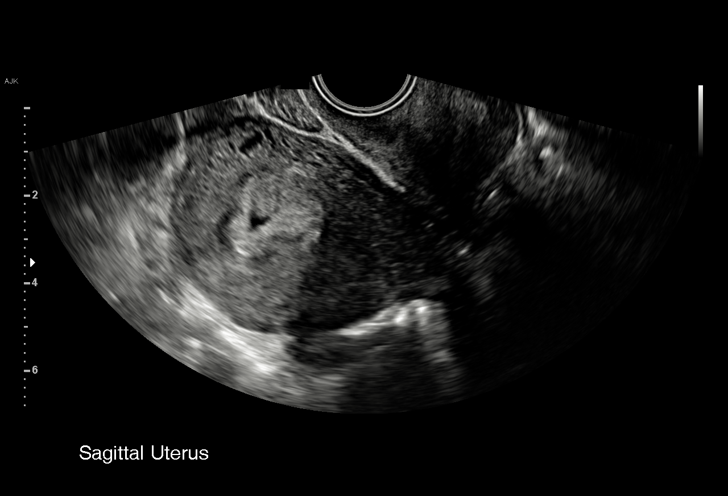
[im 47/85]
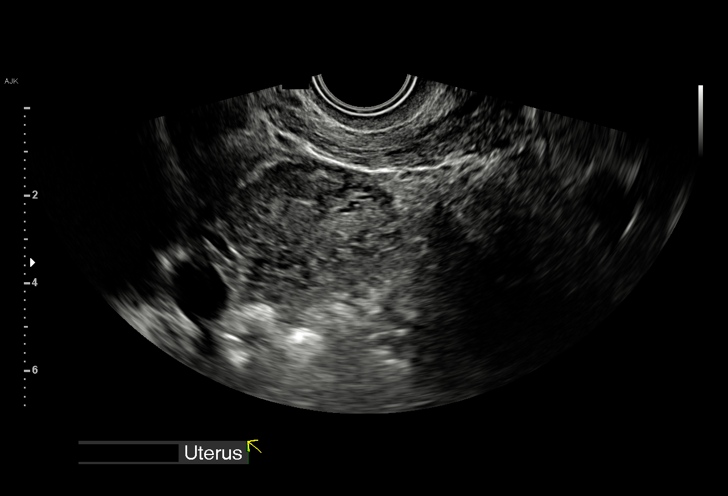
[im 53/85]
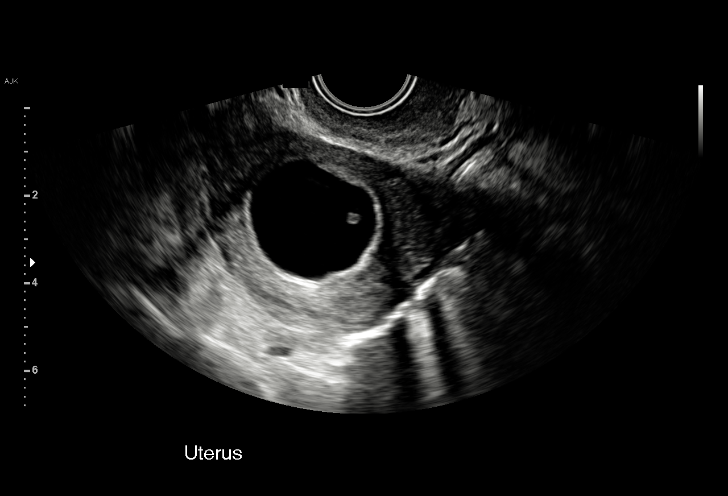
[im 60/85]
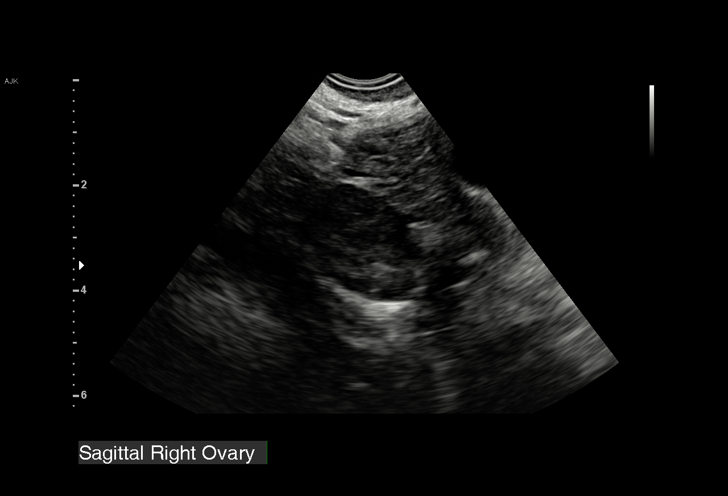
[im 66/85]
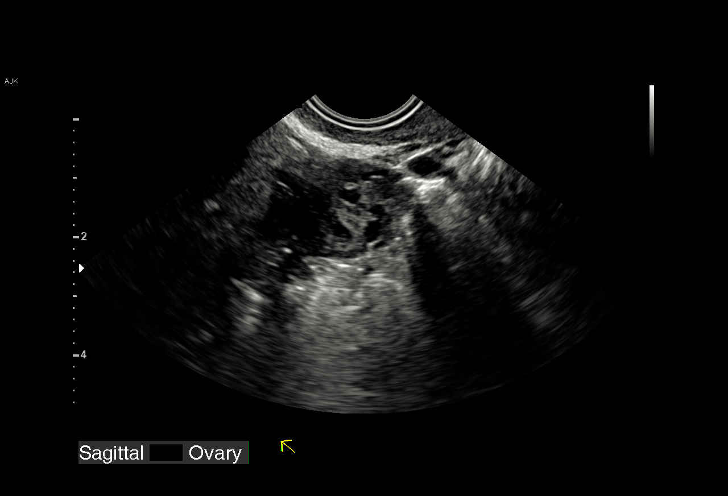
[im 72/85]
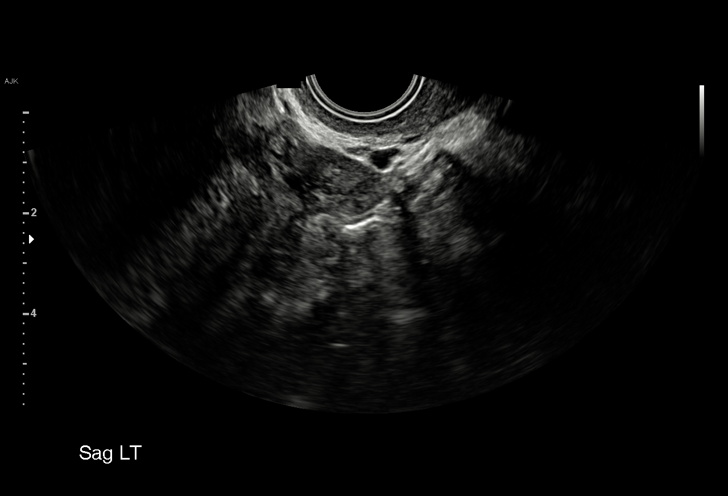
[im 78/85]
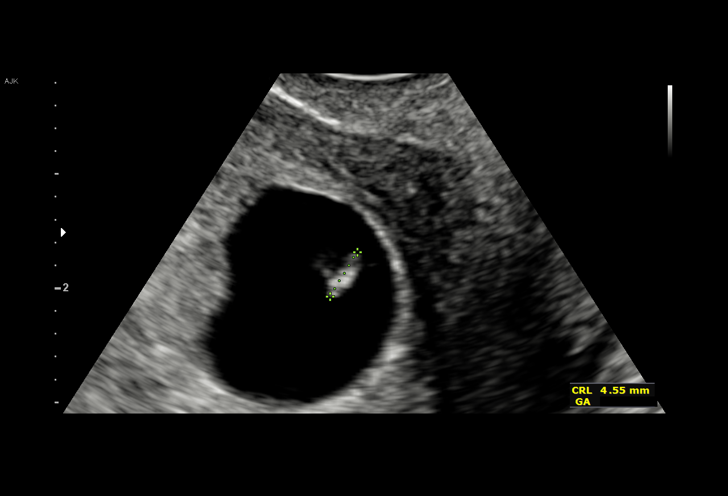
[im 85/85]
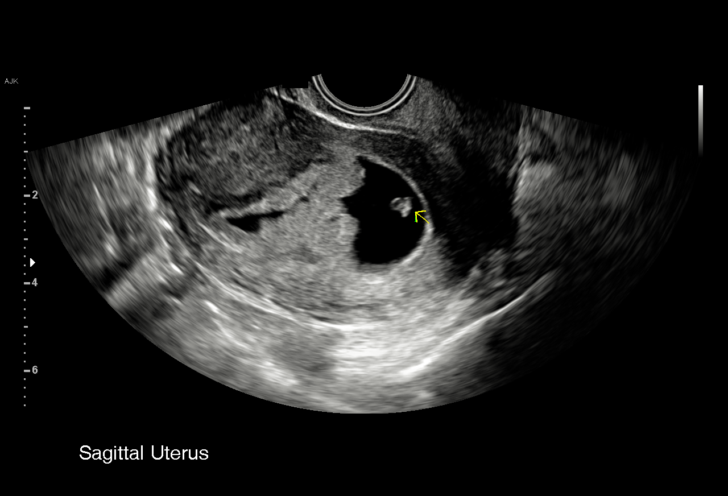

[15 of 28 positions shown; findings below may reference images not displayed]

FINDINGS: Intrauterine gestational sac: Single

Yolk sac:  Visualized.

Embryo:  Visualized.

Cardiac Activity: Visualized.

Heart Rate: 102 bpm

CRL: 4.5  mm   6 w   1 d                  US EDC: 04/26/2020

Subchorionic hemorrhage:  None visualized.

Maternal uterus/adnexae: Normal ovaries. No adnexal mass. No pelvic
free fluid.
IMPRESSION: 1. Single live intrauterine pregnancy as detailed above.
2. Heart rate is relatively decreased at 102 beats per minute.
Recommend follow-up pelvic ultrasound in 1-2 weeks.

## 2020-12-21 IMAGING — US US OB TRANSVAGINAL
1 series · 15 of 27 positions shown · non-contrast
Comparison: 09/02/2019

CLINICAL DATA: Vaginal bleeding during early pregnancy, bradycardia
on prior exam

EXAM:
TRANSVAGINAL OB ULTRASOUND
TECHNIQUE: Transvaginal ultrasound was performed for complete evaluation of the
gestation as well as the maternal uterus, adnexal regions, and
pelvic cul-de-sac.

[Series 1: us ob transvaginal · 27 acquisitions, 15 frames shown]
[im 1/27]
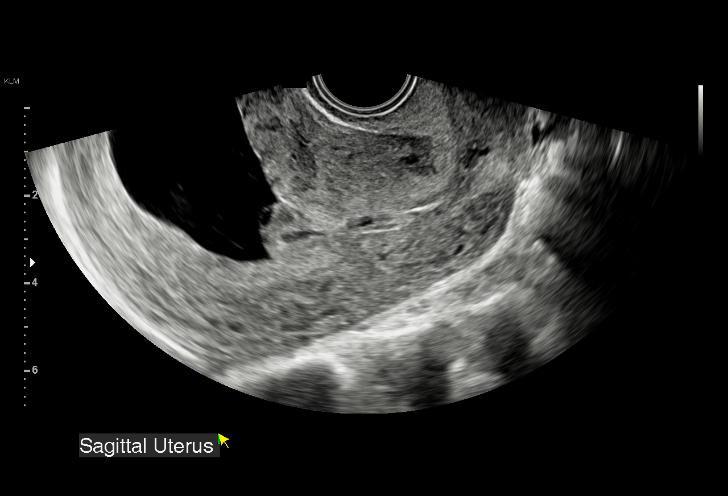
[im 3/27]
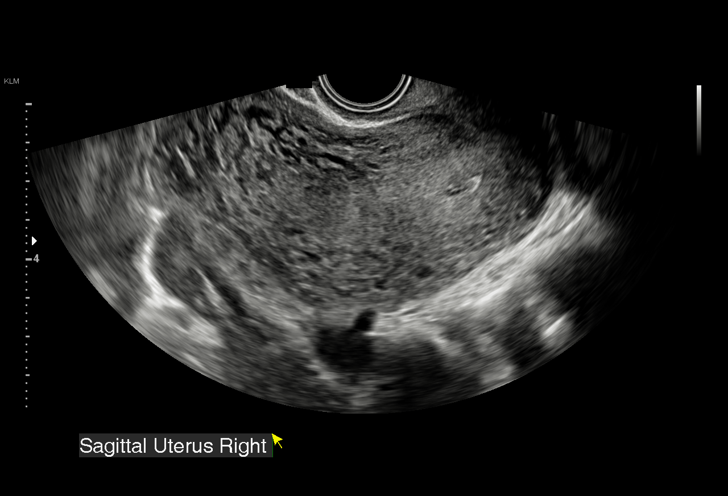
[im 5/27]
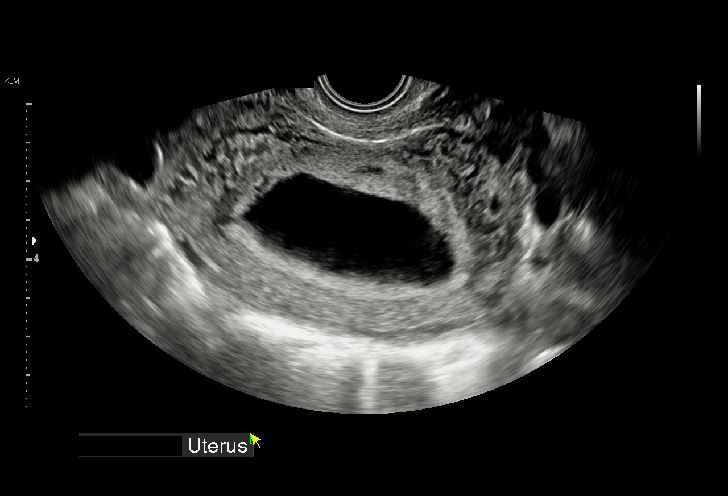
[im 7/27]
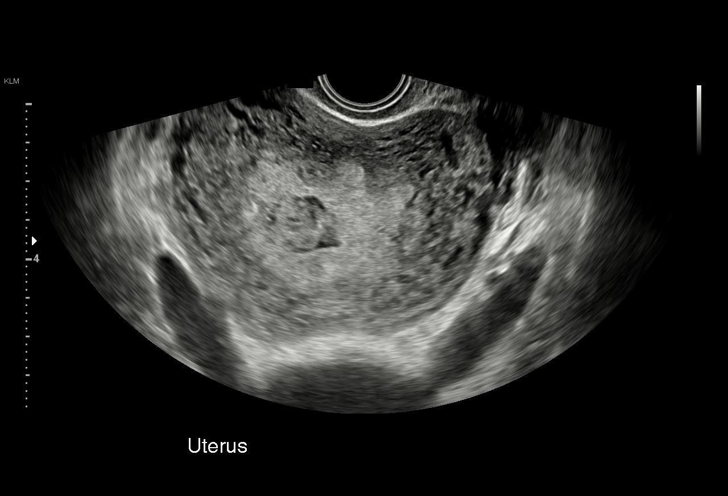
[im 9/27]
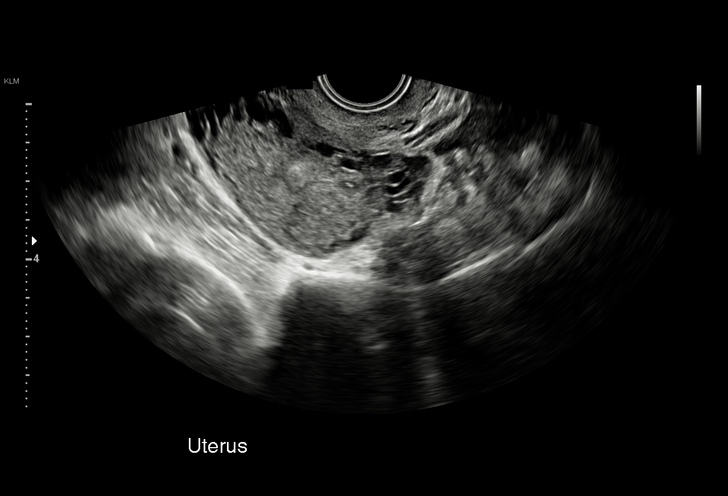
[im 10/27]
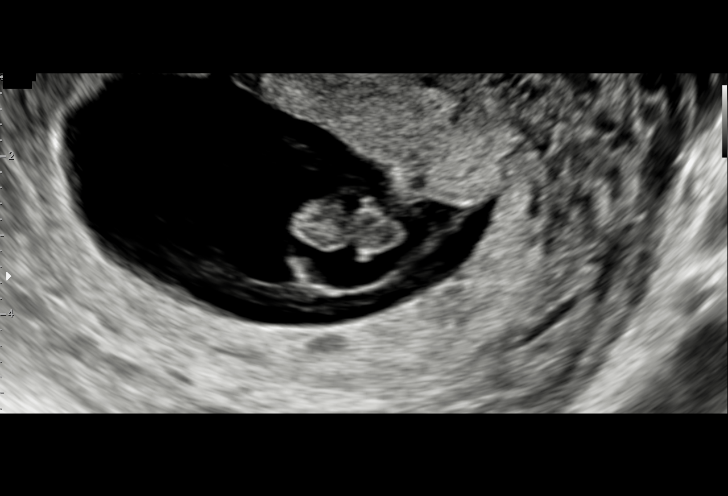
[im 12/27]
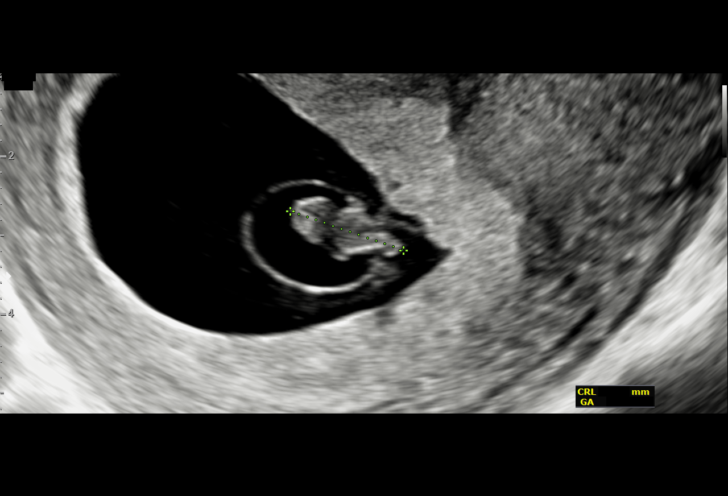
[im 14/27]
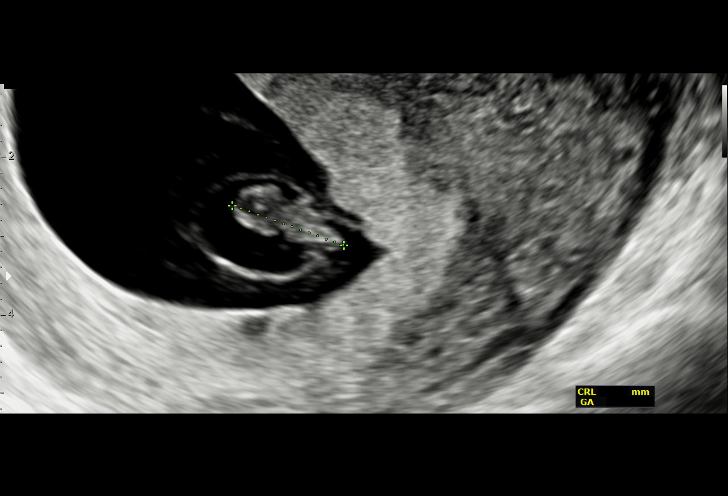
[im 16/27]
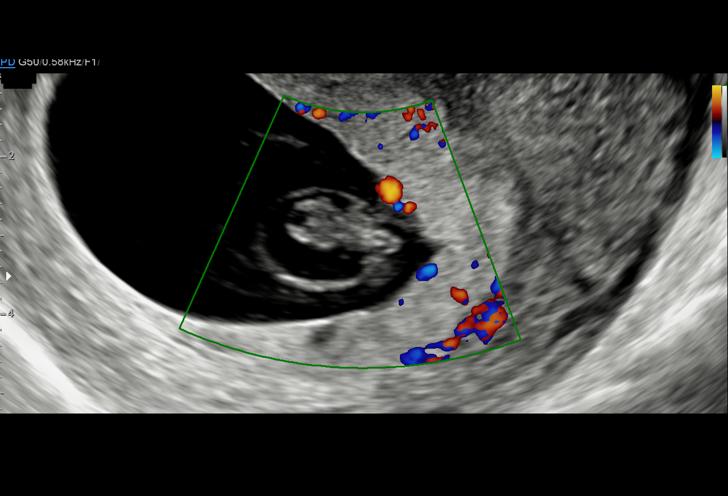
[im 18/27]
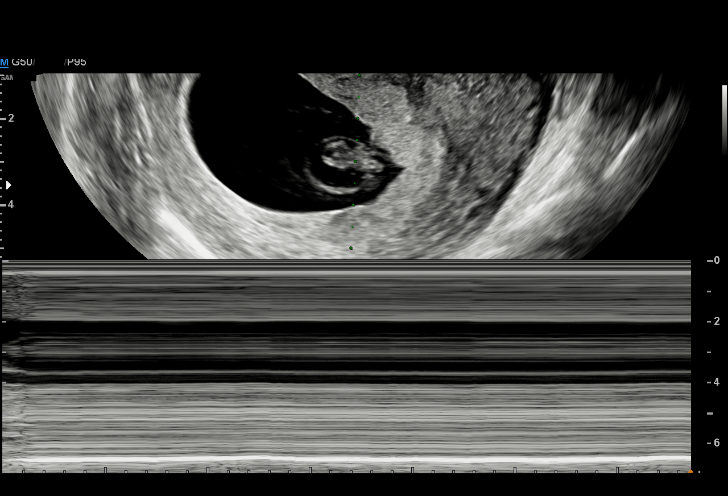
[im 19/27]
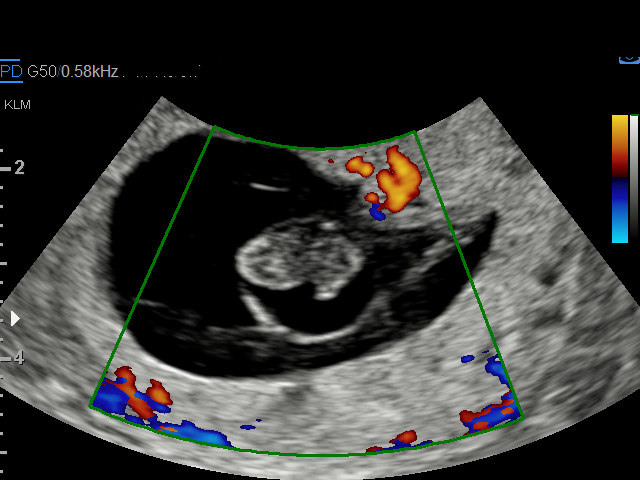
[im 21/27]
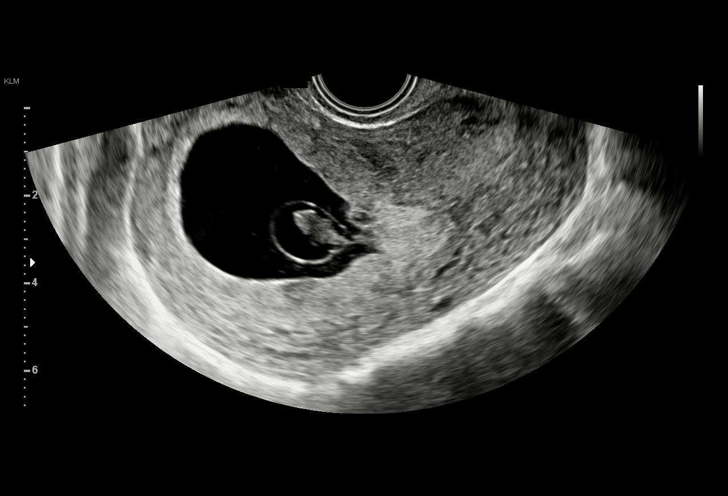
[im 23/27]
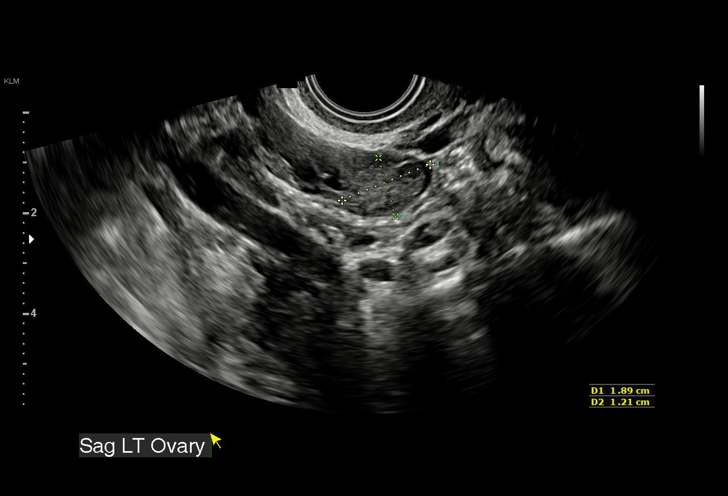
[im 25/27]
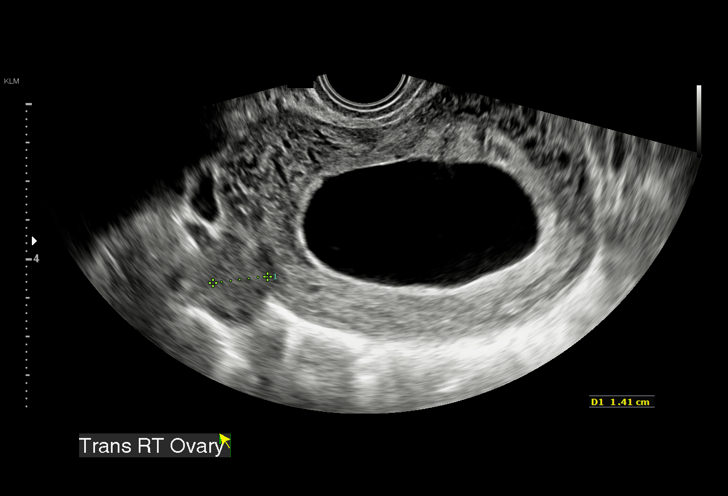
[im 27/27]
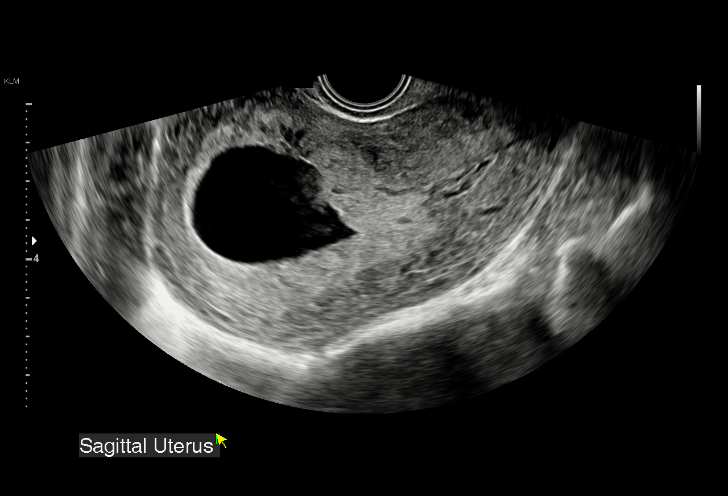

[15 of 27 positions shown; findings below may reference images not displayed]

FINDINGS: Intrauterine gestational sac: Present, single, slightly irregular,
less irregular than on prior exam

Yolk sac:  Present

Embryo:  Present

Cardiac Activity: None identified

Heart Rate: N/A bpm

CRL:   15.1 mm   7 w 6 d                  US EDC:

Subchorionic hemorrhage:  None visualized.

Maternal uterus/adnexae:

Uterus otherwise normal appearance

RIGHT ovary normal size and morphology, 2.3 x 1.3 x 1.4 cm.

LEFT ovary normal size and morphology, 1.9 x 1.2 x 1.7 cm.

No free pelvic fluid or adnexal masses.
IMPRESSION: Single intrauterine gestation identified with crown-rump length
mm.

No fetal cardiac activity is seen.

Findings meet definitive criteria for failed pregnancy. This follows
SRU consensus guidelines: Diagnostic Criteria for Nonviable
Pregnancy Early in the First Trimester. N Engl J Med

## 2020-12-29 IMAGING — US US OB TRANSVAGINAL
1 series · 15 of 28 positions shown · non-contrast
Comparison: 09/15/2019 and 09/02/2019.

CLINICAL DATA: Patient diagnosed with a miscarriage on 09/15/2019.
Given site attack on 09/16/2019. Nausea and vomiting. Vaginal
spotting. Patient should be 9 weeks and 1 day pregnant based on her
last ultrasound, which is dated 09/02/2019.

EXAM:
TRANSVAGINAL OB ULTRASOUND
TECHNIQUE: Transvaginal ultrasound was performed for complete evaluation of the
gestation as well as the maternal uterus, adnexal regions, and
pelvic cul-de-sac.

[Series 1: us ob transvaginal · 15 of 51 slices shown]
[im 1/51]
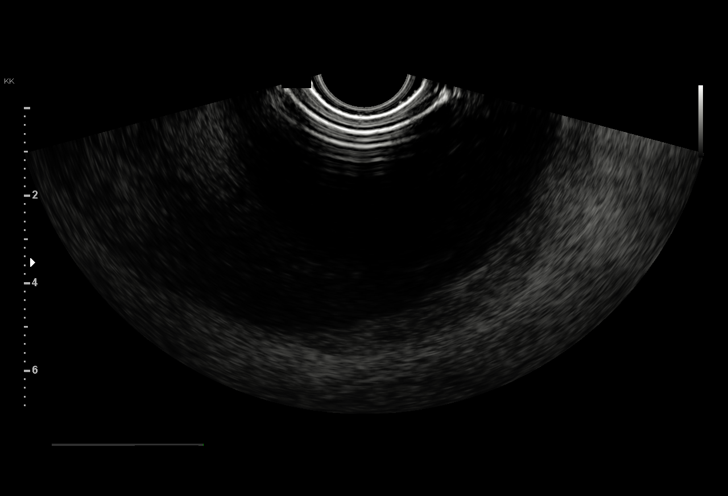
[im 4/51]
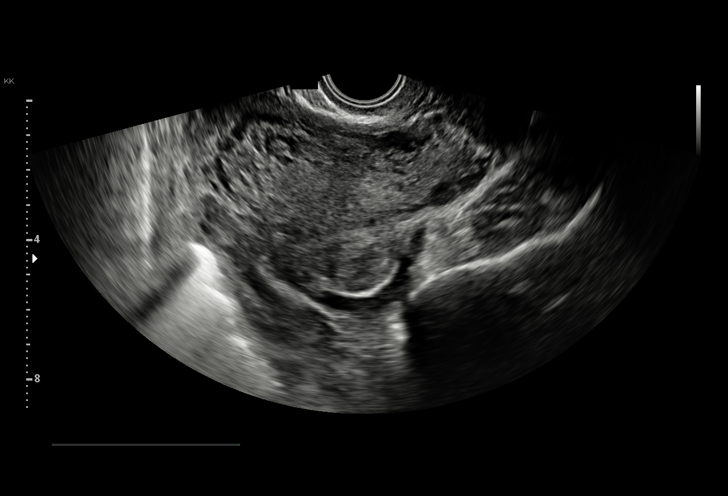
[im 8/51]
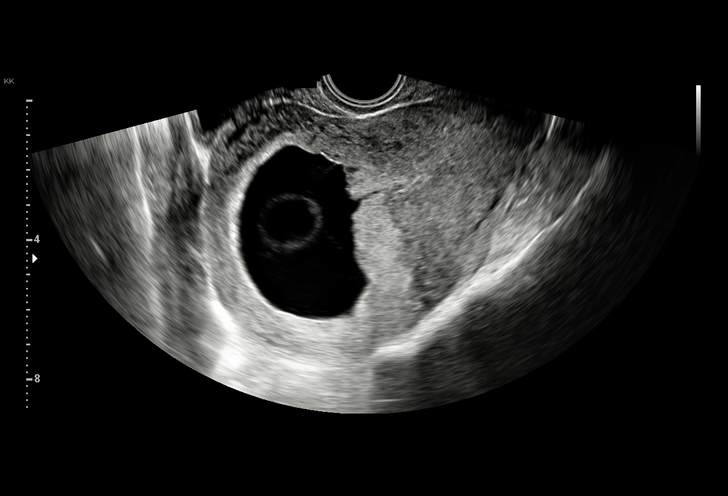
[im 12/51]
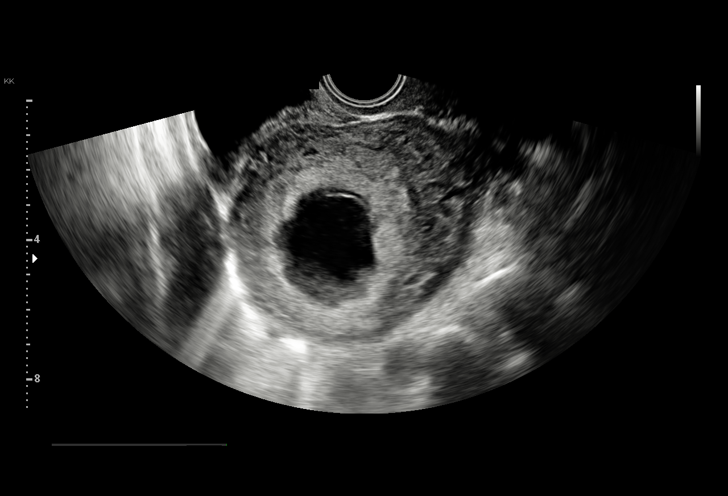
[im 15/51]
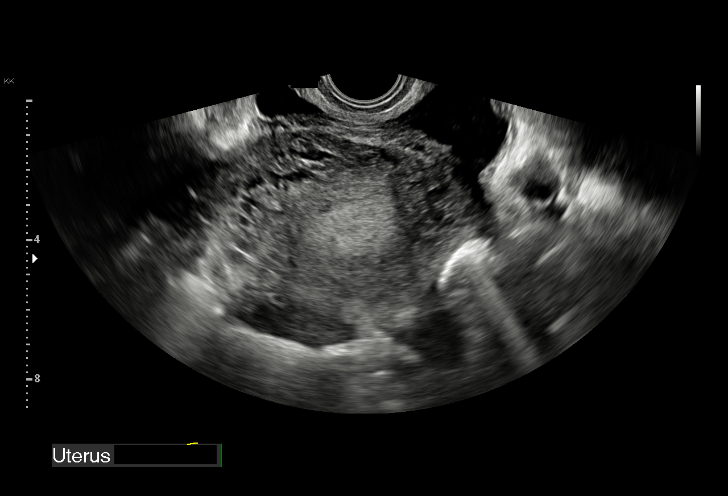
[im 19/51]
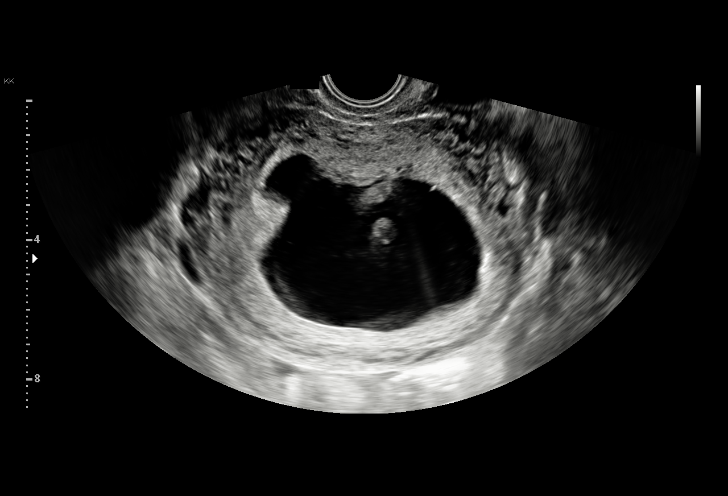
[im 23/51]
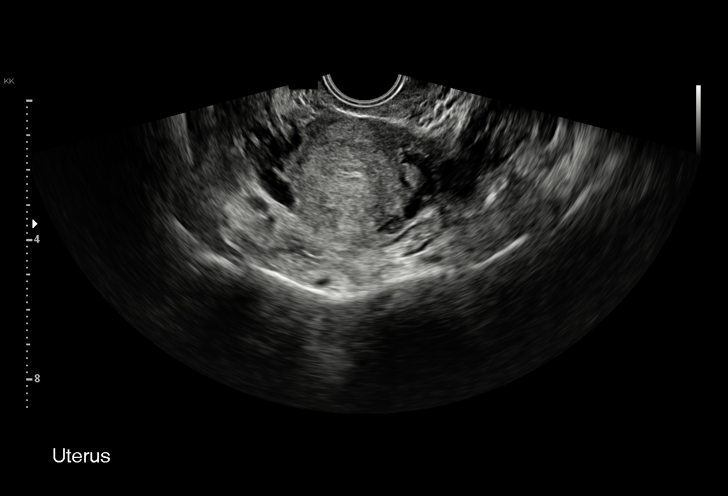
[im 26/51]
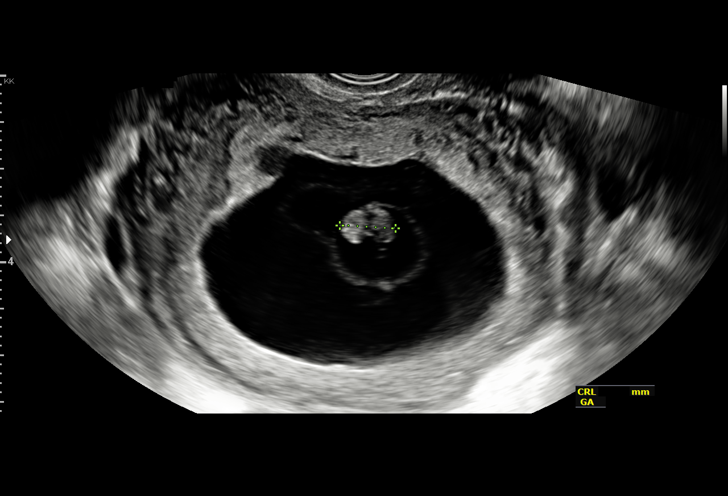
[im 28/51]
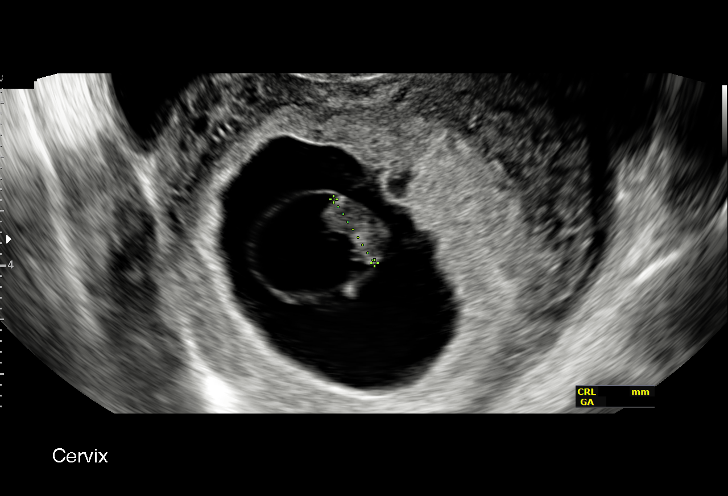
[im 32/51]
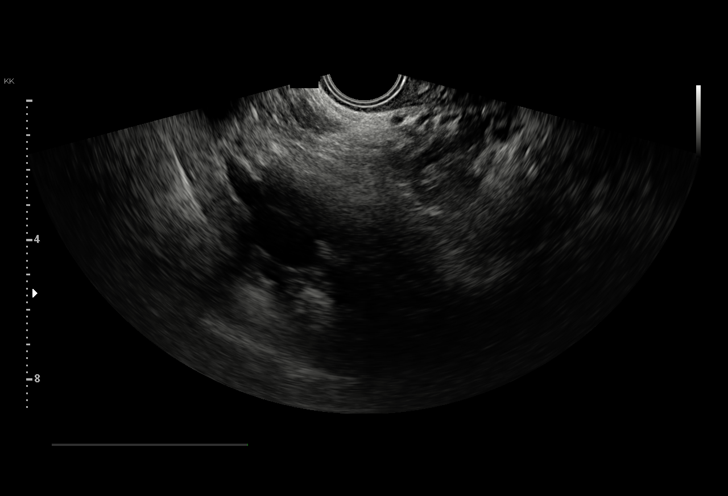
[im 36/51]
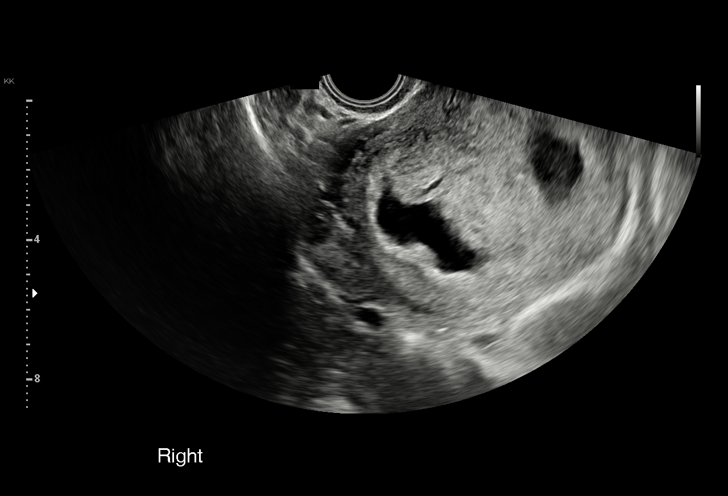
[im 39/51]
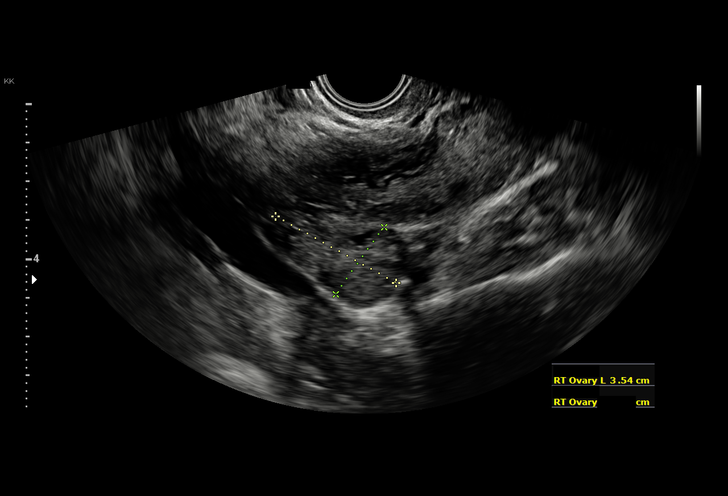
[im 43/51]
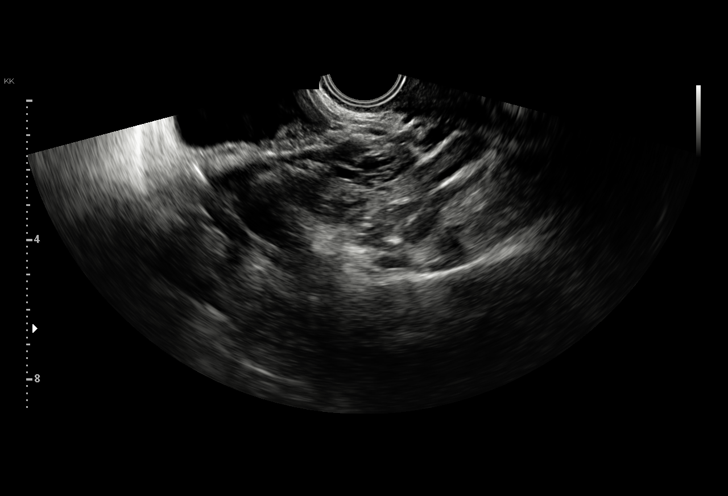
[im 47/51]
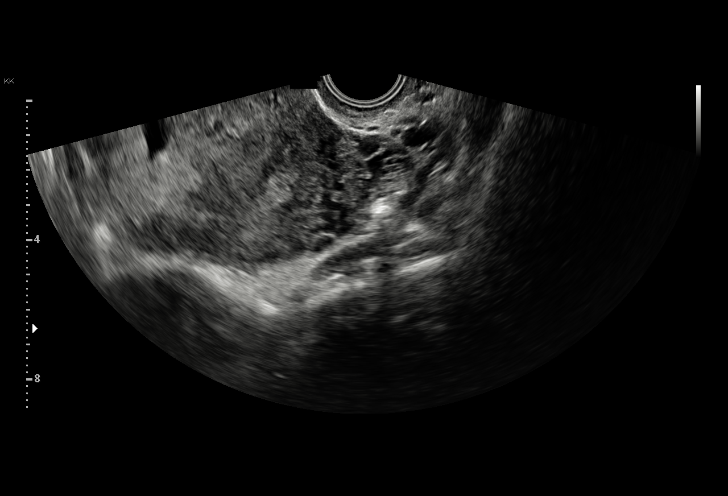
[im 51/51]
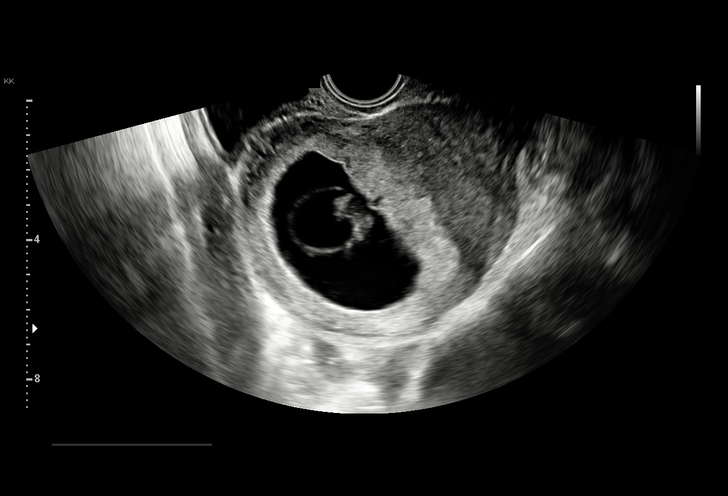

[15 of 28 positions shown; findings below may reference images not displayed]

FINDINGS: Intrauterine gestational sac: Irregular gestational sac.

Yolk sac:  Not Visualized.

Embryo:  Visualized.

Cardiac Activity: Not Visualized.

Heart Rate: Not detectable.

CRL:   12.5 mm   7 w 3 d                  US EDC: 05/08/2020

Subchorionic hemorrhage:  None visualized.

Maternal uterus/adnexae: No uterine masses. Cervix appears closed.
Normal right ovary. Left ovary not visualized. No adnexal masses. No
pelvic free fluid.
IMPRESSION: 1. Failed intrauterine pregnancy. Embryo visualized with a
crown-rump length of 12.5 mm, measuring 15.1 mm on the most recent
prior ultrasound. No detectable fetal cardiac activity or heart
rate. Relatively large and irregular gestational sac.
